# Patient Record
Sex: Female | Born: 1935
Health system: Southern US, Community
[De-identification: ages and names within clinical notes are randomized; demographics above are authoritative.]

## PROBLEM LIST (undated history)

## (undated) DIAGNOSIS — G459 Transient cerebral ischemic attack, unspecified: Secondary | ICD-10-CM

## (undated) DIAGNOSIS — K219 Gastro-esophageal reflux disease without esophagitis: Secondary | ICD-10-CM

## (undated) DIAGNOSIS — F419 Anxiety disorder, unspecified: Secondary | ICD-10-CM

## (undated) DIAGNOSIS — N3090 Cystitis, unspecified without hematuria: Secondary | ICD-10-CM

## (undated) DIAGNOSIS — I4729 Other ventricular tachycardia: Secondary | ICD-10-CM

## (undated) DIAGNOSIS — M1990 Unspecified osteoarthritis, unspecified site: Secondary | ICD-10-CM

## (undated) DIAGNOSIS — I10 Essential (primary) hypertension: Secondary | ICD-10-CM

## (undated) DIAGNOSIS — I493 Ventricular premature depolarization: Secondary | ICD-10-CM

## (undated) DIAGNOSIS — R001 Bradycardia, unspecified: Secondary | ICD-10-CM

## (undated) DIAGNOSIS — I472 Ventricular tachycardia: Secondary | ICD-10-CM

## (undated) HISTORY — DX: Essential (primary) hypertension: I10

## (undated) HISTORY — DX: Anxiety disorder, unspecified: F41.9

## (undated) HISTORY — DX: Transient cerebral ischemic attack, unspecified: G45.9

## (undated) HISTORY — DX: Gastro-esophageal reflux disease without esophagitis: K21.9

## (undated) HISTORY — DX: Cystitis, unspecified without hematuria: N30.90

## (undated) HISTORY — DX: Unspecified osteoarthritis, unspecified site: M19.90

## (undated) HISTORY — DX: Ventricular premature depolarization: I49.3

## (undated) HISTORY — DX: Bradycardia, unspecified: R00.1

## (undated) HISTORY — PX: TOTAL ABDOMINAL HYSTERECTOMY: SHX209

## (undated) HISTORY — DX: Other ventricular tachycardia: I47.29

## (undated) HISTORY — DX: Ventricular tachycardia: I47.2

## (undated) HISTORY — PX: KNEE SURGERY: SHX244

---

## 2001-11-08 ENCOUNTER — Encounter: Payer: Self-pay | Admitting: Internal Medicine

## 2001-11-08 ENCOUNTER — Encounter: Admission: RE | Admit: 2001-11-08 | Discharge: 2001-11-08 | Payer: Self-pay | Admitting: Internal Medicine

## 2001-11-14 ENCOUNTER — Encounter: Payer: Self-pay | Admitting: Internal Medicine

## 2001-11-14 ENCOUNTER — Encounter: Admission: RE | Admit: 2001-11-14 | Discharge: 2001-11-14 | Payer: Self-pay | Admitting: Internal Medicine

## 2002-05-15 ENCOUNTER — Encounter (INDEPENDENT_AMBULATORY_CARE_PROVIDER_SITE_OTHER): Payer: Self-pay | Admitting: Specialist

## 2002-05-15 ENCOUNTER — Ambulatory Visit (HOSPITAL_BASED_OUTPATIENT_CLINIC_OR_DEPARTMENT_OTHER): Admission: RE | Admit: 2002-05-15 | Discharge: 2002-05-15 | Payer: Self-pay | Admitting: Urology

## 2004-06-19 ENCOUNTER — Ambulatory Visit (HOSPITAL_COMMUNITY): Admission: RE | Admit: 2004-06-19 | Discharge: 2004-06-19 | Payer: Self-pay | Admitting: Gastroenterology

## 2004-06-19 ENCOUNTER — Encounter (INDEPENDENT_AMBULATORY_CARE_PROVIDER_SITE_OTHER): Payer: Self-pay | Admitting: Specialist

## 2008-10-27 ENCOUNTER — Emergency Department (HOSPITAL_COMMUNITY): Admission: EM | Admit: 2008-10-27 | Discharge: 2008-10-27 | Payer: Self-pay | Admitting: Family Medicine

## 2008-11-18 ENCOUNTER — Ambulatory Visit (HOSPITAL_COMMUNITY): Admission: RE | Admit: 2008-11-18 | Discharge: 2008-11-18 | Payer: Self-pay | Admitting: Internal Medicine

## 2009-06-05 ENCOUNTER — Ambulatory Visit (HOSPITAL_COMMUNITY): Admission: RE | Admit: 2009-06-05 | Discharge: 2009-06-05 | Payer: Self-pay | Admitting: Gastroenterology

## 2009-06-09 ENCOUNTER — Encounter: Payer: Self-pay | Admitting: Internal Medicine

## 2009-06-10 ENCOUNTER — Ambulatory Visit (HOSPITAL_COMMUNITY): Admission: RE | Admit: 2009-06-10 | Discharge: 2009-06-10 | Payer: Self-pay | Admitting: Interventional Cardiology

## 2009-06-26 DIAGNOSIS — I10 Essential (primary) hypertension: Secondary | ICD-10-CM | POA: Insufficient documentation

## 2009-06-27 ENCOUNTER — Ambulatory Visit: Payer: Self-pay | Admitting: Internal Medicine

## 2009-06-27 DIAGNOSIS — I472 Ventricular tachycardia, unspecified: Secondary | ICD-10-CM | POA: Insufficient documentation

## 2009-06-27 DIAGNOSIS — I4729 Other ventricular tachycardia: Secondary | ICD-10-CM | POA: Insufficient documentation

## 2009-07-09 ENCOUNTER — Ambulatory Visit: Payer: Self-pay | Admitting: Internal Medicine

## 2009-07-09 DIAGNOSIS — I498 Other specified cardiac arrhythmias: Secondary | ICD-10-CM | POA: Insufficient documentation

## 2009-07-10 ENCOUNTER — Ambulatory Visit: Payer: Self-pay | Admitting: Internal Medicine

## 2009-07-10 ENCOUNTER — Inpatient Hospital Stay (HOSPITAL_COMMUNITY): Admission: AD | Admit: 2009-07-10 | Discharge: 2009-07-11 | Payer: Self-pay | Admitting: Internal Medicine

## 2009-07-28 ENCOUNTER — Encounter: Payer: Self-pay | Admitting: Emergency Medicine

## 2009-07-28 ENCOUNTER — Ambulatory Visit: Payer: Self-pay | Admitting: Cardiology

## 2009-07-28 ENCOUNTER — Ambulatory Visit: Payer: Self-pay | Admitting: Internal Medicine

## 2009-07-29 ENCOUNTER — Encounter: Payer: Self-pay | Admitting: Internal Medicine

## 2009-07-29 ENCOUNTER — Encounter: Payer: Self-pay | Admitting: Cardiology

## 2009-07-29 ENCOUNTER — Inpatient Hospital Stay (HOSPITAL_COMMUNITY): Admission: EM | Admit: 2009-07-29 | Discharge: 2009-08-01 | Payer: Self-pay | Admitting: Interventional Cardiology

## 2009-08-05 ENCOUNTER — Emergency Department (HOSPITAL_COMMUNITY): Admission: EM | Admit: 2009-08-05 | Discharge: 2009-08-05 | Payer: Self-pay | Admitting: Emergency Medicine

## 2009-08-06 ENCOUNTER — Encounter: Payer: Self-pay | Admitting: Internal Medicine

## 2009-08-08 ENCOUNTER — Ambulatory Visit (HOSPITAL_COMMUNITY): Admission: AD | Admit: 2009-08-08 | Discharge: 2009-08-09 | Payer: Self-pay | Admitting: Internal Medicine

## 2009-08-08 ENCOUNTER — Ambulatory Visit: Payer: Self-pay | Admitting: Internal Medicine

## 2009-08-25 ENCOUNTER — Telehealth: Payer: Self-pay | Admitting: Internal Medicine

## 2009-08-25 ENCOUNTER — Encounter: Payer: Self-pay | Admitting: Internal Medicine

## 2009-09-26 ENCOUNTER — Ambulatory Visit: Payer: Self-pay | Admitting: Internal Medicine

## 2009-10-10 ENCOUNTER — Encounter: Admission: RE | Admit: 2009-10-10 | Discharge: 2009-10-10 | Payer: Self-pay | Admitting: Gastroenterology

## 2010-09-22 NOTE — Progress Notes (Signed)
Summary: not feeling well  Phone Note Call from Patient Call back at Home Phone [phone]   Caller: Patient Reason for Call: Talk to Nurse Action Taken: Appt Scheduled Summary of Call: feeling very weak and nausea since thursday... no chest pain Initial call taken by: Migdalia Dk,  August 25, 2009 8:08 AM  Follow-up for Phone Call        spoke with pt.  Nausea and weakness gradually went away then came back on Thurs. Her HR for the  first week or two post hospital was  69-72 now teh HR is running  between 82-88. Discussed with Dr Johney Frame he doesn't think it is related to her heart but will have her come in for EKG only Dennis Bast, RN, BSN  August 25, 2009 9:37 AM   Pt came for EKG looks good NSR w [facility] HR-86 Follow-up by: Hillis Range, MD,  August 25, 2009 8:57 PM  Additional Follow-up for Phone Call Additional follow up Details #1::        Pt seen by me in the office today.  Nausea and fatigue are her complaints.  Recent cath normal by Dr Eldridge Dace.  Symptoms preceed ablation procedure.  Today, Ekg reveals sinus rhythm without bradycardia.  there are no pvcs and no ischemia changes.  Will switch H2 blocker to PPI.  Decrease ASA to 81mg  daily.  Follow-up with PCP if symptoms persist.    New/Updated Medications: PROTONIX 40 MG TBEC (PANTOPRAZOLE SODIUM) one by mouth qd Prescriptions: PROTONIX 40 MG TBEC (PANTOPRAZOLE SODIUM) one by mouth qd  #30 x 6   Entered by:   Dennis Bast, RN, BSN   Authorized by:   Hillis Range, MD   Signed by:   Dennis Bast, RN, BSN on 08/25/2009   Method used:   Electronically to        [facility] (retail)       [phone] [address]       [place], [place]  [id number]       Ph: [phone]       Fax: [phone]   RxID:   [id number]    Patient Instructions: 1)  Your physician recommends that you schedule a follow-up appointment in: 4 weeks with Dr Johney Frame 2)  Your physician has recommended you make  the following change in your medication: decrease ASA to 81mg  and start Protonix 40mg  once daily

## 2010-09-22 NOTE — Letter (Signed)
Summary: ELectrophysiology/Ablation Procedure Instructions  [facility], Main Office  [address]   [place], [place] [address]   Phone: [phone]  Fax: [phone]     Electrophysiology/Ablation Procedure Instructions    You are scheduled for a VT ablation  on 08/08/09 at 7:30 am with Dr. Johney Frame  1.  Please come to the Short Stay Center at [facility] at 6:00am on the day of your procedure  2.  Come prepared to stay overnight.   Please bring your insurance cards and a list of your medications.  3.  Do not have anything to eat or drink after midnight the night before your procedure.     * Occasionally, EP studies and ablations can become lengthy.  Please make your family aware of this before your procedure starts.  Average time ranges from 2-8 hours for EP studies/ablations.  Your physician will locate your family after the procedure with the results.  * If you have any questions after you get home, please call the office at [phone].  Anselm Pancoast

## 2010-09-22 NOTE — Consult Note (Signed)
Summary: [facility]   Imported By: Roderic Ovens 08/19/2009 16:08:05  _____________________________________________________________________  External Attachment:    Type:   Image     Comment:   External Document

## 2010-09-22 NOTE — Assessment & Plan Note (Signed)
Summary: 3 wks/okmper kathy/dmp   Visit Type:  Follow-up Referring Provider:  Everette Rank, MD Primary Provider:  Theressa Millard, MD   History of Present Illness: The patient presents today for electrophysiology followup .She reports doing well since last being seen in our clinic. The patient denies symptoms of palpitations, chest pain, shortness of breath, orthopnea, PND, lower extremity edema, dizziness, presyncope, syncope, or neurologic sequela.  She reports occasional fatigue, but feels that this improved since stopping metoprolol. The patient is tolerating medications without difficulties and is otherwise without complaint today.   Current Medications (verified): 1)  Ranitidine Hcl 150 Mg Tabs (Ranitidine Hcl) .... Once Daily 2)  Aspirin 81 Mg Tbec (Aspirin) .... Take One Tablet By Mouth Daily 3)  Verapamil Hcl Cr 180 Mg Xr24h-Cap (Verapamil Hcl) .... Take One Capsule By Mouth Daily 4)  Multivitamins   Tabs (Multiple Vitamin) .... Once Daily 5)  Vitamin B-12 500 Mcg  Tabs (Cyanocobalamin) .... 2 Once Daily 6)  Calcium Carbonate-Vitamin D 600-400 Mg-Unit  Tabs (Calcium Carbonate-Vitamin D) .... Two Times A Day 7)  Estrace 0.1 Mg/gm Crea (Estradiol) .... As Needed 8)  Lidocaine Hcl 2 % Gel (Lidocaine Hcl) .... As Needed 9)  Valium 5 Mg Tabs (Diazepam) .... As Needed  Allergies: 1)  ! Sulfa  Past History:  Past Medical History:  hypertension   cystitis  PVCs, NSVT  GERD  Anxiety  DJD  Bradycardia  Past Surgical History: Reviewed history from 06/27/2009 and no changes required. TAH 1978 s/p knee surgery x 2  Family History: Reviewed history from 06/27/2009 and no changes required. CAD,  1 sister died suddenly in her sleep after her third MI.  Another brother and sister have CAD.  No other h/o sudden death  Social History: Reviewed history from 06/27/2009 and no changes required. Pt lives in [place] with husband.  Retired.  Denies tobacco, ETOH, drugs.  Review of  Systems       All systems are reviewed and negative except as listed in the HPI.   Vital Signs:  Patient profile:   77 year old female Height:      64 inches Weight:      156 pounds BMI:     26.87 Pulse rate:   56 / minute Pulse rhythm:   irregular BP sitting:   126 / 70  (left arm)  Vitals Entered By: Laurance Flatten CMA (July 09, 2009 9:40 AM)  Physical Exam  General:  Well developed, well nourished, in no acute distress. Head:  normocephalic and atraumatic Eyes:  PERRLA/EOM intact; conjunctiva and lids normal. Nose:  no deformity, discharge, inflammation, or lesions Mouth:  Teeth, gums and palate normal. Oral mucosa normal. Neck:  Neck supple, no JVD. No masses, thyromegaly or abnormal cervical nodes. Lungs:  Clear bilaterally to auscultation and percussion. Heart:  Huston Foley regular rhythm, frequent ectopy. Abdomen:  Bowel sounds positive; abdomen soft and non-tender without masses, organomegaly, or hernias noted. No hepatosplenomegaly. Msk:  Back normal, normal gait. Muscle strength and tone normal. Pulses:  pulses normal in all 4 extremities Extremities:  No clubbing or cyanosis. Neurologic:  Alert and oriented x 3. Skin:  Intact without lesions or rashes. Cervical Nodes:  no significant adenopathy Psych:  Normal affect.    CXR  Procedure date:  10/27/2008  Findings:      Normal cardiomediastinal silhouette.  COPD/emphysema.   Linear scarring versus subsegmental atelectasis at the left base.   No airspace opacities.    IMPRESSION:   Findings  consistent with COPD/emphysema.  Linear scarring versus   subsegmental atelectasis at the left base.  [place] of Abdomen  Procedure date:  11/14/2001  Findings:      ABDOMINAL ULTRASOUND  LIVER APPEARS SONOGRAPHICALLY NORMAL.  NO INTRAHEPATIC   DUCTAL DILATATION OR FOCAL HEPATIC LESIONS.  GALLBLADDER   IS SONOGRAPHICALLY NORMAL.  THE COMMON BILE DUCT IS NORMAL   IN CALIBER MEASURING 3.8 MM. THE PANCREAS IS FAIRLY    WELL-VISUALIZED AND DEMONSTRATES NO SONOGRAPHIC   ABNORMALITIES.  BOTH KIDNEYS ARE NORMAL IN SIZE AND   ECHOGENICITY WITHOUT EVIDENCE OF HYDRONEPHROSIS.  LOWER   POLE REGIONS OF BOTH KIDNEYS ARE NOT WELL-VISUALIZED DUE   TO COLONIC GAS.  SPLEEN IS NORMAL IN SIZE AND   ECHOGENICITY.  THE AORTA IS NORMAL IN CALIBER.    IMPRESSION:  1.  UNREMARKABLE ABDOMINAL ULTRASOUND   EXAMINATION   Signed by Marrion Coy, CNA on 06/26/2009 at 2:47 PM  ________________________________________________________________________     Cardiac Cath  Procedure date:  06/10/2009  Findings:       IMPRESSION:   1. No significant coronary artery disease.   2. Low normal left ventricular function.   3. Normal hemodynamics.   4. No abdominal aortic aneurysm or renal artery stenosis.      RECOMMENDATIONS:  We will review the case with EP.  We will obtain an   echocardiogram.  If she continues to have the ventricular tachycardia,   we could either increase her beta-blocker or consider adding a calcium   channel blocker like verapamil.  Depending on EP's recommendations, we   may keep the patient overnight or let her go home.  Regardless, her   episodic ventricular tachycardia needs to be controlled to avoid a   tachycardia-induced cardiomyopathy.               EKG  Procedure date:  07/09/2009  Findings:      sinus rhythm 60 bpm, frequent NSVT (RBB L SUP axis)  Impression & Recommendations:  Problem # 1:  VENTRICULAR TACHYCARDIA, PAROXYSMAL (ICD-427.1) The patient has NSVT and frequent PVCs despite medical therapy with verapamil.  She has not well tolerated metoprolol and further up titration of verapamil is limited by bradycardia.  I am concerned that she may develop a cardiomyopathy with this frequent ventricular ectopy.  I have discussed treatment options including medicines and ablation with the patient.  She is clear that she wishes to defer catheter ablation at this time. After discussion  with Dr Eldridge Dace, I have offered the patient flecainide for suppression of her symptomatic arrhythmia.  She has no significant CAD.  She is aware that this medicine does carry increased risk for ventricular arrhythmias and death.  We will start the patient on flecainide 100mg  two times a day in the hospital at the next available time. No changes are made today.  Her updated medication list for this problem includes:    Aspirin 81 Mg Tbec (Aspirin) .Marland Kitchen... Take one tablet by mouth daily    Verapamil Hcl Cr 180 Mg Xr24h-cap (Verapamil hcl) .Marland Kitchen... Take one capsule by mouth daily  Problem # 2:  HYPERTENSION (ICD-401.9) stable, no changes Her updated medication list for this problem includes:    Aspirin 81 Mg Tbec (Aspirin) .Marland Kitchen... Take one tablet by mouth daily    Verapamil Hcl Cr 180 Mg Xr24h-cap (Verapamil hcl) .Marland Kitchen... Take one capsule by mouth daily  Problem # 3:  BRADYCARDIA (ICD-427.89) As above  Her updated medication list for this problem includes:  Aspirin 81 Mg Tbec (Aspirin) .Marland Kitchen... Take one tablet by mouth daily    Verapamil Hcl Cr 180 Mg Xr24h-cap (Verapamil hcl) .Marland Kitchen... Take one capsule by mouth daily

## 2010-09-22 NOTE — Assessment & Plan Note (Signed)
Summary: eph/post ablation/jml   Visit Type:  Follow-up Referring Provider:  Everette Rank, MD Primary Provider:  Theressa Millard, MD   History of Present Illness: The patient presents today for routine electrophysiology followup. She reports doing very well since last being seen in our clinic. The patient denies symptoms of palpitations, chest pain, shortness of breath, orthopnea, PND, lower extremity edema, dizziness, presyncope, syncope, or neurologic sequela. The patient is tolerating medications without difficulties and is otherwise without complaint today.  Her nausea has resolved since stopping aspirin.  Current Medications (verified): 1)  Protonix 40 Mg Tbec (Pantoprazole Sodium) .... One By Mouth Qd 2)  Multivitamins   Tabs (Multiple Vitamin) .... Once Daily 3)  Vitamin B-12 1000 Mcg Tabs (Cyanocobalamin) .... Once Daily 4)  Calcium Carbonate-Vitamin D 600-400 Mg-Unit  Tabs (Calcium Carbonate-Vitamin D) .... Two Times A Day 5)  Estrace 0.1 Mg/gm Crea (Estradiol) .... As Needed 6)  Lidocaine Hcl 2 % Gel (Lidocaine Hcl) .... As Needed 7)  Valium 5 Mg Tabs (Diazepam) .... As Needed  Allergies: 1)  ! Sulfa  Past History:  Past Medical History:  hypertension   cystitis  PVCs, NSVT s/p successful ablation  GERD  Anxiety  DJD  Bradycardia due to PVCs, resolved after PVC ablation  Social History: Reviewed history from 06/27/2009 and no changes required. Pt lives in [place] with husband.  Retired.  Denies tobacco, ETOH, drugs.  Vital Signs:  Patient profile:   77 year old female Height:      64 inches Weight:      146 pounds BMI:     25.15 Pulse rate:   73 / minute BP sitting:   124 / 75  (right arm)  Vitals Entered By: Laurance Flatten CMA (September 26, 2009 11:27 AM)  Physical Exam  General:  Well developed, well nourished, in no acute distress. Head:  normocephalic and atraumatic Eyes:  PERRLA/EOM intact; conjunctiva and lids normal. Nose:  no deformity,  discharge, inflammation, or lesions Mouth:  Teeth, gums and palate normal. Oral mucosa normal. Neck:  Neck supple, no JVD. No masses, thyromegaly or abnormal cervical nodes. Lungs:  Clear bilaterally to auscultation and percussion. Heart:  Non-displaced PMI, chest non-tender; regular rate and rhythm, S1, S2 without murmurs, rubs or gallops. Carotid upstroke normal, no bruit. Normal abdominal aortic size, no bruits. Femorals normal pulses, no bruits. Pedals normal pulses. No edema, no varicosities. Abdomen:  Bowel sounds positive; abdomen soft and non-tender without masses, organomegaly, or hernias noted. No hepatosplenomegaly. Msk:  Back normal, normal gait. Muscle strength and tone normal. Pulses:  pulses normal in all 4 extremities Extremities:  No clubbing or cyanosis. Neurologic:  Alert and oriented x 3. Skin:  Intact without lesions or rashes. Cervical Nodes:  no significant adenopathy Psych:  Normal affect.   EKG  Procedure date:  09/26/2009  Findings:      sinus rhythm 73 bpm, normal ekg  Impression & Recommendations:  Problem # 1:  VENTRICULAR TACHYCARDIA, PAROXYSMAL (ICD-427.1) The patients PVCs and NSVT were successfully ablation. She is doing well off of any medicines for this. I will see her as needed.  Problem # 2:  BRADYCARDIA (ICD-427.89) resolved with PVC ablation no further workup planned  Problem # 3:     Other Orders: EKG w/ Interpretation (93000)  Patient Instructions: 1)  I have returned the entirety of her cardiology care back to Dr Isabel Caprice.  I will see her as needed.

## 2010-09-22 NOTE — Letter (Signed)
Summary: Dr Eldridge Dace note  Dr Eldridge Dace note   Imported By: Kassie Mends 07/11/2009 13:29:11  _____________________________________________________________________  External Attachment:    Type:   Image     Comment:   External Document

## 2010-09-22 NOTE — Assessment & Plan Note (Signed)
Summary: nep/ VT/PT HAS BCBS/ PER DR.Kirill Chatterjee ON 10/19 GD   Visit Type:  Initial Consult Referring Provider:  Everette Rank, MD Primary Provider:  Theressa Millard, MD  CC:  VT.  History of Present Illness: Crystal Chang is a pleasant 77 yo WF without significant PMH who presents today for EP consultation regarding ventricular arrhyhtmias.  She reports that during a recent colonoscopy, she was observed to have frequent PVCs and nonsustained ventricular tachycardia.  She was asymptomatic at that time.  She was then evaluated by Dr Eldridge Dace  and initiated on metoprolol.  In retrospect she reports symptoms of "fluttering".  She reports that this occurs 2-3 times per day, lasting only several seconds.  She denies chest pain, SOB, or syncope.  She reports remote symptoms of presyncope but none recently.  She also reports orthostatic dizziness.  She had LHC 10/10 which showed EF 50% and no significant CAD. She was started on verapamil at that time.  SInce then, she reports significant decline in energy and decreased exercise tolerance.   Current Medications (verified): 1)  Ranitidine Hcl 150 Mg Tabs (Ranitidine Hcl) .... Once Daily 2)  Aspirin 81 Mg Tbec (Aspirin) .... Take One Tablet By Mouth Daily 3)  Metoprolol Succinate 25 Mg Xr24h-Tab (Metoprolol Succinate) .... Take One Tablet By Mouth Daily 4)  Verapamil Hcl Cr 180 Mg Xr24h-Cap (Verapamil Hcl) .... Take One Capsule By Mouth Daily 5)  Multivitamins   Tabs (Multiple Vitamin) .... Once Daily 6)  Vitamin B-12 500 Mcg  Tabs (Cyanocobalamin) .... 2 Once Daily 7)  Calcium Carbonate-Vitamin D 600-400 Mg-Unit  Tabs (Calcium Carbonate-Vitamin D) .... Two Times A Day 8)  Estrace 0.1 Mg/gm Crea (Estradiol) .... As Needed 9)  Lidocaine Hcl 2 % Gel (Lidocaine Hcl) .... As Needed 10)  Valium 5 Mg Tabs (Diazepam) .... As Needed  Allergies (verified): 1)  ! Sulfa  Past History:  Past Medical History:  hypertension   cystitis  PVCs, NSVT  GERD  Anxiety   DJD  Past Surgical History: TAH 1978 s/p knee surgery x 2  Family History: CAD,  1 sister died suddenly in her sleep after her third MI.  Another brother and sister have CAD.  No other h/o sudden death  Social History: Pt lives in [place] with husband.  Retired.  Denies tobacco, ETOH, drugs.  Review of Systems       All systems are reviewed and negative except as listed in the HPI.   Vital Signs:  Patient profile:   77 year old female Height:      64 inches Weight:      155 pounds BMI:     26.70 Pulse rate:   63 / minute BP sitting:   120 / 64  (left arm) Cuff size:   regular  Vitals Entered By: Hardin Negus, RMA (June 27, 2009 11:44 AM)  Physical Exam  General:  Well developed, well nourished, in no acute distress. Head:  normocephalic and atraumatic Eyes:  PERRLA/EOM intact; conjunctiva and lids normal. Nose:  no deformity, discharge, inflammation, or lesions Mouth:  Teeth, gums and palate normal. Oral mucosa normal. Neck:  Neck supple, no JVD. No masses, thyromegaly or abnormal cervical nodes. Lungs:  Clear bilaterally to auscultation and percussion. Heart:  Non-displaced PMI, chest non-tender; regular rate and rhythm, S1, S2 without murmurs, rubs or gallops. Carotid upstroke normal, no bruit. Normal abdominal aortic size, no bruits. Femorals normal pulses, no bruits. Pedals normal pulses. No edema, no varicosities. Abdomen:  Bowel sounds positive; abdomen soft and non-tender without masses, organomegaly, or hernias noted. No hepatosplenomegaly. Msk:  Back normal, normal gait. Muscle strength and tone normal. Pulses:  pulses normal in all 4 extremities Extremities:  No clubbing or cyanosis. Neurologic:  Alert and oriented x 3. Skin:  Intact without lesions or rashes. Cervical Nodes:  no significant adenopathy Psych:  Normal affect.   EKG  Procedure date:  06/27/2009  Findings:      sinus bradycardia with PVCs in trigeminy.  PVC morphology is RBB L  superior axis.  PVCs are not closely coupled to T wave. Otherwise unremarkable.  Impression & Recommendations:  Problem # 1:  VENTRICULAR TACHYCARDIA, PAROXYSMAL (ICD-427.1) The patient presents today for further evaluation of asymptomatic NSVT and PVCs.  Her arrhythmia burden has significantly improved with verapamil.  She has developed fatigue, likely due to bradycardia (metoprolol).  If have reviewed her echocardiogram and cath from Dr Maryruth Hancock office which reveal preserved EF without significant CAD.  Therapeutic strategies for NSVT including medicine and ablation were discussed in detail with the patient today. Risk, benefits, and alternatives to EP study and radiofrequency ablation were also discussed in detail today. These risks include but are not limited to stroke, bleeding, vascular damage, tamponade, perforation, damage to the heart requiring pacemaker, and death. The patient understands these risk and wishes to continue medical therapy.  She has a preserved EF and is therefore low risk for sudden death.   At this point, I will stop metoprolol and continue verapamil. If she develops symptoms of VT, then we will consider antiarrhythmics or ablation in the future. She will contact my office if presyncope or syncope occur.  Problem # 2:  HYPERTENSION (ICD-401.9) stable, we will stop metoprolol today  The following medications were removed from the medication list:    Metoprolol Succinate 25 Mg Xr24h-tab (Metoprolol succinate) .Marland Kitchen... Take one tablet by mouth daily Her updated medication list for this problem includes:    Aspirin 81 Mg Tbec (Aspirin) .Marland Kitchen... Take one tablet by mouth daily    Verapamil Hcl Cr 180 Mg Xr24h-cap (Verapamil hcl) .Marland Kitchen... Take one capsule by mouth daily  Patient Instructions: 1)  Your physician recommends that you schedule a follow-up appointment in: 3 weeks with Dr Johney Frame 2)  Your physician has recommended you make the following change in your medication: Stop  Metoprolol

## 2010-11-23 LAB — CBC
HCT: 40.5 % (ref 36.0–46.0)
Hemoglobin: 14.2 g/dL (ref 12.0–15.0)
MCHC: 35.1 g/dL (ref 30.0–36.0)
MCV: 93.7 fL (ref 78.0–100.0)
Platelets: 288 10*3/uL (ref 150–400)
RBC: 4.32 MIL/uL (ref 3.87–5.11)
RDW: 12.6 % (ref 11.5–15.5)
WBC: 7.4 10*3/uL (ref 4.0–10.5)

## 2010-11-23 LAB — BASIC METABOLIC PANEL
BUN: 10 mg/dL (ref 6–23)
CO2: 22 mEq/L (ref 19–32)
Calcium: 10 mg/dL (ref 8.4–10.5)
Chloride: 105 mEq/L (ref 96–112)
Creatinine, Ser: 0.82 mg/dL (ref 0.4–1.2)
GFR calc non Af Amer: >60 mL/min (ref >60)
Glucose, Bld: 108 mg/dL — ABNORMAL HIGH (ref 70–99)
Potassium: 3.8 mEq/L (ref 3.5–5.1)
Sodium: 136 mEq/L (ref 135–145)

## 2010-11-23 LAB — PROTIME-INR
INR: 1.11 (ref 0.00–1.49)
Prothrombin Time: 14.2 seconds (ref 11.6–15.2)

## 2010-11-23 LAB — APTT: aPTT: 29 seconds (ref 24–37)

## 2010-11-24 LAB — COMPREHENSIVE METABOLIC PANEL
ALT: 27 U/L (ref 0–35)
AST: 52 U/L — ABNORMAL HIGH (ref 0–37)
Albumin: 3.6 g/dL (ref 3.5–5.2)
Alkaline Phosphatase: 75 U/L (ref 39–117)
BUN: 15 mg/dL (ref 6–23)
CO2: 25 mEq/L (ref 19–32)
Calcium: 9.4 mg/dL (ref 8.4–10.5)
Chloride: 102 mEq/L (ref 96–112)
Creatinine, Ser: 1.26 mg/dL — ABNORMAL HIGH (ref 0.4–1.2)
GFR calc Af Amer: 50 mL/min — ABNORMAL LOW (ref >60)
GFR calc non Af Amer: 41 mL/min — ABNORMAL LOW (ref >60)
Glucose, Bld: 156 mg/dL — ABNORMAL HIGH (ref 70–99)
Potassium: 3.7 mEq/L (ref 3.5–5.1)
Sodium: 134 mEq/L — ABNORMAL LOW (ref 135–145)
Total Bilirubin: 0.8 mg/dL (ref 0.3–1.2)
Total Protein: 6.6 g/dL (ref 6.0–8.3)

## 2010-11-24 LAB — URINE MICROSCOPIC-ADD ON

## 2010-11-24 LAB — CBC
HCT: 35 % — ABNORMAL LOW (ref 36.0–46.0)
HCT: 36.3 % (ref 36.0–46.0)
HCT: 38.4 % (ref 36.0–46.0)
HCT: 38.9 % (ref 36.0–46.0)
HCT: 39.4 % (ref 36.0–46.0)
Hemoglobin: 12.3 g/dL (ref 12.0–15.0)
Hemoglobin: 12.4 g/dL (ref 12.0–15.0)
Hemoglobin: 13 g/dL (ref 12.0–15.0)
Hemoglobin: 13.4 g/dL (ref 12.0–15.0)
Hemoglobin: 13.8 g/dL (ref 12.0–15.0)
MCHC: 33.8 g/dL (ref 30.0–36.0)
MCHC: 34.3 g/dL (ref 30.0–36.0)
MCHC: 34.5 g/dL (ref 30.0–36.0)
MCHC: 35 g/dL (ref 30.0–36.0)
MCHC: 35.1 g/dL (ref 30.0–36.0)
MCV: 93.9 fL (ref 78.0–100.0)
MCV: 94.4 fL (ref 78.0–100.0)
MCV: 94.7 fL (ref 78.0–100.0)
MCV: 94.9 fL (ref 78.0–100.0)
MCV: 94.9 fL (ref 78.0–100.0)
Platelets: 263 10*3/uL (ref 150–400)
Platelets: 288 10*3/uL (ref 150–400)
Platelets: 294 10*3/uL (ref 150–400)
Platelets: 318 10*3/uL (ref 150–400)
Platelets: 338 10*3/uL (ref 150–400)
RBC: 3.69 MIL/uL — ABNORMAL LOW (ref 3.87–5.11)
RBC: 3.82 MIL/uL — ABNORMAL LOW (ref 3.87–5.11)
RBC: 4.07 MIL/uL (ref 3.87–5.11)
RBC: 4.11 MIL/uL (ref 3.87–5.11)
RBC: 4.2 MIL/uL (ref 3.87–5.11)
RDW: 12.3 % (ref 11.5–15.5)
RDW: 12.5 % (ref 11.5–15.5)
RDW: 12.5 % (ref 11.5–15.5)
RDW: 12.8 % (ref 11.5–15.5)
RDW: 12.8 % (ref 11.5–15.5)
WBC: 10.2 10*3/uL (ref 4.0–10.5)
WBC: 10.2 10*3/uL (ref 4.0–10.5)
WBC: 11.2 10*3/uL — ABNORMAL HIGH (ref 4.0–10.5)
WBC: 8.1 10*3/uL (ref 4.0–10.5)
WBC: 8.6 10*3/uL (ref 4.0–10.5)

## 2010-11-24 LAB — LIPID PANEL
HDL: 55 mg/dL (ref >39)
Total CHOL/HDL Ratio: 3.1 RATIO
Triglycerides: 91 mg/dL (ref <150)
VLDL: 18 mg/dL (ref 0–40)

## 2010-11-24 LAB — BASIC METABOLIC PANEL
BUN: 10 mg/dL (ref 6–23)
BUN: 10 mg/dL (ref 6–23)
BUN: 10 mg/dL (ref 6–23)
BUN: 10 mg/dL (ref 6–23)
BUN: 18 mg/dL (ref 6–23)
CO2: 22 mEq/L (ref 19–32)
CO2: 26 mEq/L (ref 19–32)
CO2: 27 mEq/L (ref 19–32)
CO2: 28 mEq/L (ref 19–32)
CO2: 28 mEq/L (ref 19–32)
Calcium: 9.2 mg/dL (ref 8.4–10.5)
Calcium: 9.3 mg/dL (ref 8.4–10.5)
Calcium: 9.7 mg/dL (ref 8.4–10.5)
Calcium: 9.8 mg/dL (ref 8.4–10.5)
Calcium: 9.8 mg/dL (ref 8.4–10.5)
Chloride: 101 mEq/L (ref 96–112)
Chloride: 102 mEq/L (ref 96–112)
Chloride: 105 mEq/L (ref 96–112)
Chloride: 106 mEq/L (ref 96–112)
Chloride: 107 mEq/L (ref 96–112)
Creatinine, Ser: 0.84 mg/dL (ref 0.4–1.2)
Creatinine, Ser: 0.88 mg/dL (ref 0.4–1.2)
Creatinine, Ser: 0.89 mg/dL (ref 0.4–1.2)
Creatinine, Ser: 1 mg/dL (ref 0.4–1.2)
Creatinine, Ser: 1.06 mg/dL (ref 0.4–1.2)
GFR calc Af Amer: >60 mL/min (ref >60)
GFR calc non Af Amer: 50 mL/min — ABNORMAL LOW (ref >60)
GFR calc non Af Amer: 54 mL/min — ABNORMAL LOW (ref >60)
GFR calc non Af Amer: >60 mL/min (ref >60)
GFR calc non Af Amer: >60 mL/min (ref >60)
GFR calc non Af Amer: >60 mL/min (ref >60)
Glucose, Bld: 100 mg/dL — ABNORMAL HIGH (ref 70–99)
Glucose, Bld: 104 mg/dL — ABNORMAL HIGH (ref 70–99)
Glucose, Bld: 105 mg/dL — ABNORMAL HIGH (ref 70–99)
Glucose, Bld: 119 mg/dL — ABNORMAL HIGH (ref 70–99)
Glucose, Bld: 92 mg/dL (ref 70–99)
Potassium: 3.9 mEq/L (ref 3.5–5.1)
Potassium: 4.1 mEq/L (ref 3.5–5.1)
Potassium: 4.1 mEq/L (ref 3.5–5.1)
Potassium: 4.2 mEq/L (ref 3.5–5.1)
Potassium: 4.9 mEq/L (ref 3.5–5.1)
Sodium: 133 mEq/L — ABNORMAL LOW (ref 135–145)
Sodium: 133 mEq/L — ABNORMAL LOW (ref 135–145)
Sodium: 138 mEq/L (ref 135–145)
Sodium: 139 mEq/L (ref 135–145)
Sodium: 140 mEq/L (ref 135–145)

## 2010-11-24 LAB — CK TOTAL AND CKMB (NOT AT ARMC)
CK, MB: 6.9 ng/mL — ABNORMAL HIGH (ref 0.3–4.0)
Relative Index: 5 — ABNORMAL HIGH (ref 0.0–2.5)
Total CK: 137 U/L (ref 7–177)

## 2010-11-24 LAB — POCT I-STAT, CHEM 8
BUN: 23 mg/dL (ref 6–23)
Calcium, Ion: 1.13 mmol/L (ref 1.12–1.32)
Chloride: 105 mEq/L (ref 96–112)
Creatinine, Ser: 0.7 mg/dL (ref 0.4–1.2)
Glucose, Bld: 105 mg/dL — ABNORMAL HIGH (ref 70–99)
HCT: 43 % (ref 36.0–46.0)
Hemoglobin: 14.6 g/dL (ref 12.0–15.0)
Potassium: 4.6 mEq/L (ref 3.5–5.1)
Sodium: 133 mEq/L — ABNORMAL LOW (ref 135–145)
TCO2: 25 mmol/L (ref 0–100)

## 2010-11-24 LAB — POCT CARDIAC MARKERS
CKMB, poc: 1.8 ng/mL (ref 1.0–8.0)
CKMB, poc: 3.2 ng/mL (ref 1.0–8.0)
CKMB, poc: 5.2 ng/mL (ref 1.0–8.0)
CKMB, poc: 5.5 ng/mL (ref 1.0–8.0)
Myoglobin, poc: 114 ng/mL (ref 12–200)
Myoglobin, poc: 164 ng/mL (ref 12–200)
Myoglobin, poc: 191 ng/mL (ref 12–200)
Myoglobin, poc: 233 ng/mL (ref 12–200)
Troponin i, poc: 0.22 ng/mL — ABNORMAL HIGH (ref 0.00–0.09)
Troponin i, poc: 0.24 ng/mL — ABNORMAL HIGH (ref 0.00–0.09)
Troponin i, poc: <0.05 ng/mL (ref 0.00–0.09)
Troponin i, poc: <0.05 ng/mL (ref 0.00–0.09)

## 2010-11-24 LAB — DIFFERENTIAL
Basophils Absolute: 0.1 10*3/uL (ref 0.0–0.1)
Basophils Relative: 1 % (ref 0–1)
Eosinophils Absolute: 0.1 10*3/uL (ref 0.0–0.7)
Eosinophils Relative: 1 % (ref 0–5)
Lymphocytes Relative: 13 % (ref 12–46)
Lymphs Abs: 1.4 10*3/uL (ref 0.7–4.0)
Monocytes Absolute: 0.8 10*3/uL (ref 0.1–1.0)
Monocytes Relative: 7 % (ref 3–12)
Neutro Abs: 8.9 10*3/uL — ABNORMAL HIGH (ref 1.7–7.7)
Neutrophils Relative %: 79 % — ABNORMAL HIGH (ref 43–77)

## 2010-11-24 LAB — HEPARIN LEVEL (UNFRACTIONATED)

## 2010-11-24 LAB — URINALYSIS, ROUTINE W REFLEX MICROSCOPIC
Bilirubin Urine: NEGATIVE
Glucose, UA: NEGATIVE mg/dL
Hgb urine dipstick: NEGATIVE
Nitrite: NEGATIVE
Protein, ur: 30 mg/dL — AB
Specific Gravity, Urine: 1.017 (ref 1.005–1.030)
Urobilinogen, UA: 0.2 mg/dL (ref 0.0–1.0)
pH: 6.5 (ref 5.0–8.0)

## 2010-11-24 LAB — CARDIAC PANEL(CRET KIN+CKTOT+MB+TROPI)
CK, MB: 3.8 ng/mL (ref 0.3–4.0)
CK, MB: 6.4 ng/mL — ABNORMAL HIGH (ref 0.3–4.0)
Relative Index: 5.4 — ABNORMAL HIGH (ref 0.0–2.5)
Total CK: 119 U/L (ref 7–177)
Total CK: 91 U/L (ref 7–177)

## 2010-11-24 LAB — MAGNESIUM
Magnesium: 2 mg/dL (ref 1.5–2.5)
Magnesium: 2 mg/dL (ref 1.5–2.5)
Magnesium: 2.1 mg/dL (ref 1.5–2.5)

## 2010-11-24 LAB — TSH: TSH: 0.726 u[IU]/mL (ref 0.350–4.500)

## 2010-11-24 LAB — BRAIN NATRIURETIC PEPTIDE: Pro B Natriuretic peptide (BNP): 344 pg/mL — ABNORMAL HIGH (ref 0.0–100.0)

## 2010-11-24 LAB — APTT: aPTT: 30 seconds (ref 24–37)

## 2010-11-24 LAB — TROPONIN I: Troponin I: 0.49 ng/mL — ABNORMAL HIGH (ref 0.00–0.06)

## 2010-11-24 LAB — GLUCOSE, CAPILLARY: Glucose-Capillary: 99 mg/dL (ref 70–99)

## 2010-11-24 LAB — LIPASE, BLOOD: Lipase: 21 U/L (ref 11–59)

## 2010-11-24 LAB — PROTIME-INR
INR: 1.16 (ref 0.00–1.49)
Prothrombin Time: 14.7 seconds (ref 11.6–15.2)

## 2010-11-25 LAB — CBC
HCT: 38 % (ref 36.0–46.0)
Hemoglobin: 13.4 g/dL (ref 12.0–15.0)
MCHC: 35.3 g/dL (ref 30.0–36.0)
MCV: 94.4 fL (ref 78.0–100.0)
Platelets: 245 10*3/uL (ref 150–400)
RBC: 4.02 MIL/uL (ref 3.87–5.11)
RDW: 12.8 % (ref 11.5–15.5)
WBC: 6.5 10*3/uL (ref 4.0–10.5)

## 2010-11-25 LAB — MAGNESIUM: Magnesium: 2.1 mg/dL (ref 1.5–2.5)

## 2010-11-25 LAB — BASIC METABOLIC PANEL
BUN: 15 mg/dL (ref 6–23)
CO2: 27 mEq/L (ref 19–32)
Calcium: 10 mg/dL (ref 8.4–10.5)
Chloride: 106 mEq/L (ref 96–112)
Creatinine, Ser: 1.08 mg/dL (ref 0.4–1.2)
GFR calc Af Amer: 60 mL/min — ABNORMAL LOW (ref >60)
GFR calc non Af Amer: 49 mL/min — ABNORMAL LOW (ref >60)
Glucose, Bld: 114 mg/dL — ABNORMAL HIGH (ref 70–99)
Potassium: 3.9 mEq/L (ref 3.5–5.1)
Sodium: 140 mEq/L (ref 135–145)

## 2010-11-25 LAB — PROTIME-INR
INR: 1.09 (ref 0.00–1.49)
Prothrombin Time: 14 seconds (ref 11.6–15.2)

## 2010-11-26 LAB — BASIC METABOLIC PANEL
BUN: 9 mg/dL (ref 6–23)
CO2: 24 mEq/L (ref 19–32)
Calcium: 9.6 mg/dL (ref 8.4–10.5)
Chloride: 111 mEq/L (ref 96–112)
Creatinine, Ser: 0.78 mg/dL (ref 0.4–1.2)
GFR calc Af Amer: >60 mL/min (ref >60)
GFR calc non Af Amer: >60 mL/min (ref >60)
Glucose, Bld: 101 mg/dL — ABNORMAL HIGH (ref 70–99)
Potassium: 3.9 mEq/L (ref 3.5–5.1)
Sodium: 140 mEq/L (ref 135–145)

## 2010-11-26 LAB — CBC
HCT: 39 % (ref 36.0–46.0)
Hemoglobin: 13.5 g/dL (ref 12.0–15.0)
MCHC: 34.6 g/dL (ref 30.0–36.0)
MCV: 95 fL (ref 78.0–100.0)
Platelets: 299 10*3/uL (ref 150–400)
RBC: 4.11 MIL/uL (ref 3.87–5.11)
RDW: 13.3 % (ref 11.5–15.5)
WBC: 7.4 10*3/uL (ref 4.0–10.5)

## 2011-01-05 NOTE — Assessment & Plan Note (Signed)
[facility]                                 ON-CALL NOTE   KRISHIKA, BUGGE                         MRN:          [id number]  DATE:08/02/2009                            DOB:          1930-05-23    PRIMARY ELECTROPHYSIOLOGIST:  Hillis Range, MD   PRIMARY CARDIOLOGIST:  Corky Crafts, MD   PROBLEM:  The patient contacted me earlier this morning complaining of  nausea, lightheadedness.  She was discharged home yesterday, on  Lopressor 25 b.i.d., after being taken off flecainide, which she was not  tolerating.  She has nonobstructive CAD and an ejection fraction of 50%.  She has documented, frequent NSVT.   PLAN:  I instructed Ms. Hightower to take her vital signs later this  afternoon, before her next scheduled dose of Lopressor, and to contact  me.  She has been given parameters to hold the Lopressor, based on blood  pressure and heart rate readings.  She is also to contact me in the  event of her symptoms worsening, at which time we may consider having  her come directly back to the hospital, given her ongoing symptoms.      Rozell Searing, PA-C  Electronically Signed      Rollene Rotunda, MD, [facility]  Electronically Signed   GS/MedQ  DD: 08/02/2009  DT: 08/02/2009  Job #: [id number]

## 2011-01-08 NOTE — Op Note (Signed)
NAMEHILMA, STEINHILBER                              ACCOUNT NO.:  [id number]   MEDICAL RECORD NO.:  [id number]                   PATIENT TYPE:  AMB   LOCATION:  [facility]                                 FACILITY:  [facility]   PHYSICIAN:  Jamison Neighbor, M.D.               DATE OF BIRTH:  10/18/1929   DATE OF PROCEDURE:  05/15/2002  DATE OF DISCHARGE:                                 OPERATIVE REPORT   PREOPERATIVE DIAGNOSIS:  Chronic pelvic pain, rule out interstitial  cystitis.   SECONDARY DIAGNOSIS:  Urethral caruncle.   POSTOPERATIVE DIAGNOSIS:  Chronic pelvic pain, rule out interstitial  cystitis.   PROCEDURE:  1. Cystoscopy.  2. Urethral calibration.  3. Hydrodistention of bladder.  4. Marcaine and Pyridium instillation.  5. Marcaine and Kenalog injection.  6. Bladder biopsy.   SURGEON:  Jamison Neighbor, M.D.   ANESTHESIA:  General.   COMPLICATIONS:  None.   DRAINS:  None.   BRIEF HISTORY:  This 77 year old female presented to my office in September  for evaluation of redness of the tip of the urethra.  The patient was found  to have a urethral caruncle and was treated with estrogen cream.  The  patient has had some resolution of that problem.  The patient has had  episodes of bladder pain and, in the past, had been told that she had  chronic cystitis.  She was seen by another urologist, who told her there is  nothing more that can be done for you.  The patient did some reading on her  own and concluded that she might have interstitial cystitis.  She referred  herself for evaluation of that condition.  At the time of her evaluation, we  found that she did have bladder and urethral pain, and it was thought that  she might indeed have IC.  The patient was given the option of either in-  office potassium test or cystoscopy and hydrodistention.  Based on her  reading, she elected to undergo the hydrodistention.  We have made  arrangements for her to have excision of the urethral  caruncle if it is  still present at the time of the evaluation.  The patient understands the  risks and benefits of the procedure including the fact that there is no  guarantee that she actually does have IC.  If she does have IC, she realizes  this may be a therapeutic hydrodistention but is primarily being done for  diagnostic purposes.  She gave full and informed consent.   DESCRIPTION OF PROCEDURE:  After the successful induction of general  anesthesia, the patient was placed in the dorsal lithotomy position, prepped  with Betadine, and draped in the usual sterile fashion.  Visual inspection  of the urethra showed that the caruncle had decreased significantly, and the  redness and pain in that area is much improved.  For that reason,  there is  no need to excise any caruncle.  The bimanual examination revealed no  palpable masses.  There was no evidence of diverticulum, cystocele,  rectocele, or enterocele.  The urethra was calibrated up to 53 [place] with  female urethral sounds with no significant evidence of stenosis or  stricture.  The cystoscope was inserted, and the bladder was carefully  inspected.  It was free of any tumor or stones.  Both ureteral orifices were  normal in configuration and location.  The patient's ureteral orifices were  seen to efflux clear urine.  The bladder was then distended at a pressure of  100 cm of water for five minutes.  When the bladder was drained, the patient  had an excellent bladder capacity of 1450 cc.  This would suggest that IC is  a very unlikely diagnosis.  The patient had been previously told that the  average bladder capacity for an adult female is approximately 1150 cc and  that the average for an interstitial cystitis patient is 575.  Very few if  any glomerulations were seen, and the bladder mucosa was essentially normal  following hydrodistention.  A bladder biopsy was performed.  This will be  sent to look for carcinoma in situ and  will also be sent for mast cell  analysis.  The biopsy site was cauterized with a Bugbee electrode.  The  bladder was drained.  A mixture of Marcaine and Pyridium was left within the  bladder.  Marcaine and Kenalog were injected periurethrally.  The patient  had a small vaginal pack applied.  She was given intraoperative Zofran and  Toradol as well as a B&O suppository.  She will return to the office in  follow-up.  She will be sent home with Darvocet-N 100 for pain, Pyridium  Plus for any postoperative symptoms management, and will be maintained on  antibiotic prophylaxis with Macrodantin.  When the patient returns in follow-  up, we will discuss these findings with her and likely will suggest that  what she will need is antibiotic prophylaxis to prevent infections and  symptomatic management using Pyridium Plus or other urinary agents.  It is  not felt likely that IC-directed therapy will be necessary unless the  bladder biopsy is markedly positive for mast cells.                                               Jamison Neighbor, M.D.    RJE/MEDQ  D:  05/15/2002  T:  05/15/2002  Job:  [id number]   cc:   Theressa Millard, M.D.  [address] [place]  [place] [address]  Fax: [phone]

## 2012-04-26 ENCOUNTER — Encounter: Payer: Self-pay | Admitting: Internal Medicine

## 2014-01-02 ENCOUNTER — Other Ambulatory Visit: Payer: Self-pay | Admitting: Gastroenterology

## 2015-05-30 ENCOUNTER — Other Ambulatory Visit: Payer: Self-pay | Admitting: Geriatric Medicine

## 2015-05-30 DIAGNOSIS — R51 Headache: Principal | ICD-10-CM

## 2015-05-30 DIAGNOSIS — R519 Headache, unspecified: Secondary | ICD-10-CM

## 2015-06-17 ENCOUNTER — Ambulatory Visit
Admission: RE | Admit: 2015-06-17 | Discharge: 2015-06-17 | Disposition: A | Discharge status: Home / Self Care | Payer: PPO | Source: Ambulatory Visit | Attending: Geriatric Medicine | Admitting: Geriatric Medicine

## 2015-06-17 DIAGNOSIS — R51 Headache: Principal | ICD-10-CM

## 2015-06-17 DIAGNOSIS — R519 Headache, unspecified: Secondary | ICD-10-CM

## 2015-06-17 MED ORDER — GADOBENATE DIMEGLUMINE 529 MG/ML IV SOLN
14.0000 mL | Freq: Once | INTRAVENOUS | Status: AC | PRN
  Administered 2015-06-17: 14 mL via INTRAVENOUS

## 2015-12-22 DIAGNOSIS — Z1231 Encounter for screening mammogram for malignant neoplasm of breast: Secondary | ICD-10-CM | POA: Diagnosis not present

## 2016-01-27 DIAGNOSIS — H26493 Other secondary cataract, bilateral: Secondary | ICD-10-CM | POA: Diagnosis not present

## 2016-01-27 DIAGNOSIS — H04123 Dry eye syndrome of bilateral lacrimal glands: Secondary | ICD-10-CM | POA: Diagnosis not present

## 2016-01-27 DIAGNOSIS — H524 Presbyopia: Secondary | ICD-10-CM | POA: Diagnosis not present

## 2016-01-28 ENCOUNTER — Other Ambulatory Visit: Payer: Self-pay | Admitting: Geriatric Medicine

## 2016-01-28 DIAGNOSIS — Z0000 Encounter for general adult medical examination without abnormal findings: Secondary | ICD-10-CM | POA: Diagnosis not present

## 2016-01-28 DIAGNOSIS — K219 Gastro-esophageal reflux disease without esophagitis: Secondary | ICD-10-CM | POA: Diagnosis not present

## 2016-01-28 DIAGNOSIS — M8588 Other specified disorders of bone density and structure, other site: Secondary | ICD-10-CM | POA: Diagnosis not present

## 2016-01-28 DIAGNOSIS — R229 Localized swelling, mass and lump, unspecified: Principal | ICD-10-CM

## 2016-01-28 DIAGNOSIS — R2231 Localized swelling, mass and lump, right upper limb: Secondary | ICD-10-CM | POA: Diagnosis not present

## 2016-01-28 DIAGNOSIS — E7800 Pure hypercholesterolemia, unspecified: Secondary | ICD-10-CM | POA: Diagnosis not present

## 2016-01-28 DIAGNOSIS — Z1389 Encounter for screening for other disorder: Secondary | ICD-10-CM | POA: Diagnosis not present

## 2016-01-28 DIAGNOSIS — Z79899 Other long term (current) drug therapy: Secondary | ICD-10-CM | POA: Diagnosis not present

## 2016-01-28 DIAGNOSIS — IMO0002 Reserved for concepts with insufficient information to code with codable children: Secondary | ICD-10-CM

## 2016-01-28 DIAGNOSIS — I10 Essential (primary) hypertension: Secondary | ICD-10-CM | POA: Diagnosis not present

## 2016-02-04 ENCOUNTER — Ambulatory Visit
Admission: RE | Admit: 2016-02-04 | Discharge: 2016-02-04 | Disposition: A | Discharge status: Home / Self Care | Payer: PPO | Source: Ambulatory Visit | Attending: Geriatric Medicine | Admitting: Geriatric Medicine

## 2016-02-04 DIAGNOSIS — R2231 Localized swelling, mass and lump, right upper limb: Secondary | ICD-10-CM | POA: Diagnosis not present

## 2016-02-04 DIAGNOSIS — R229 Localized swelling, mass and lump, unspecified: Principal | ICD-10-CM

## 2016-02-04 DIAGNOSIS — IMO0002 Reserved for concepts with insufficient information to code with codable children: Secondary | ICD-10-CM

## 2016-02-04 MED ORDER — GADOBENATE DIMEGLUMINE 529 MG/ML IV SOLN
14.0000 mL | Freq: Once | INTRAVENOUS | Status: AC | PRN
  Administered 2016-02-04: 14 mL via INTRAVENOUS

## 2016-04-01 DIAGNOSIS — M8588 Other specified disorders of bone density and structure, other site: Secondary | ICD-10-CM | POA: Diagnosis not present

## 2016-11-16 DIAGNOSIS — H8309 Labyrinthitis, unspecified ear: Secondary | ICD-10-CM | POA: Diagnosis not present

## 2016-12-27 DIAGNOSIS — Z1231 Encounter for screening mammogram for malignant neoplasm of breast: Secondary | ICD-10-CM | POA: Diagnosis not present

## 2017-01-12 DIAGNOSIS — D225 Melanocytic nevi of trunk: Secondary | ICD-10-CM | POA: Diagnosis not present

## 2017-01-12 DIAGNOSIS — L570 Actinic keratosis: Secondary | ICD-10-CM | POA: Diagnosis not present

## 2017-01-12 DIAGNOSIS — L814 Other melanin hyperpigmentation: Secondary | ICD-10-CM | POA: Diagnosis not present

## 2017-01-12 DIAGNOSIS — L821 Other seborrheic keratosis: Secondary | ICD-10-CM | POA: Diagnosis not present

## 2017-01-12 DIAGNOSIS — L720 Epidermal cyst: Secondary | ICD-10-CM | POA: Diagnosis not present

## 2017-01-12 DIAGNOSIS — D1801 Hemangioma of skin and subcutaneous tissue: Secondary | ICD-10-CM | POA: Diagnosis not present

## 2017-02-01 DIAGNOSIS — H26493 Other secondary cataract, bilateral: Secondary | ICD-10-CM | POA: Diagnosis not present

## 2017-02-02 DIAGNOSIS — Z79899 Other long term (current) drug therapy: Secondary | ICD-10-CM | POA: Diagnosis not present

## 2017-02-02 DIAGNOSIS — Z0000 Encounter for general adult medical examination without abnormal findings: Secondary | ICD-10-CM | POA: Diagnosis not present

## 2017-02-02 DIAGNOSIS — K219 Gastro-esophageal reflux disease without esophagitis: Secondary | ICD-10-CM | POA: Diagnosis not present

## 2017-02-02 DIAGNOSIS — Z23 Encounter for immunization: Secondary | ICD-10-CM | POA: Diagnosis not present

## 2017-02-02 DIAGNOSIS — I10 Essential (primary) hypertension: Secondary | ICD-10-CM | POA: Diagnosis not present

## 2017-02-02 DIAGNOSIS — E7800 Pure hypercholesterolemia, unspecified: Secondary | ICD-10-CM | POA: Diagnosis not present

## 2017-02-02 DIAGNOSIS — Z1389 Encounter for screening for other disorder: Secondary | ICD-10-CM | POA: Diagnosis not present

## 2017-04-02 IMAGING — MR MR FOREARM*R* WO/W CM
8 series · 38 of 40 positions shown · IV contrast (14ml multihance)
Comparison: None.

CLINICAL DATA: Right forearm mass.

EXAM:
MRI OF THE RIGHT FOREARM WITHOUT AND WITH CONTRAST
TECHNIQUE: Multiplanar, multisequence MR imaging was performed both before and
after administration of intravenous contrast.
CONTRAST:  14mL MULTIHANCE GADOBENATE DIMEGLUMINE 529 MG/ML IV SOLN

[Series 4: T1 · axial · 5.0mm · 0.44mm/px · z∈[-78,+42]mm · 6 of 22 slices shown (1 of 2)]
[im 1/22]
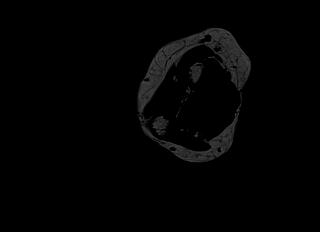
[im 5/22]
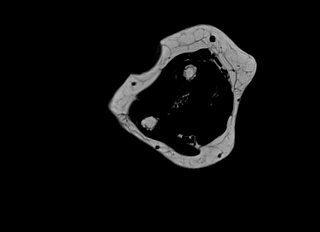
[im 9/22]
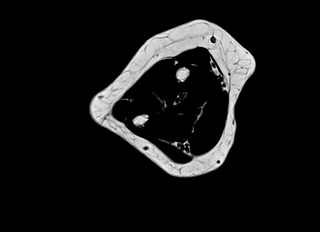
[im 13/22]
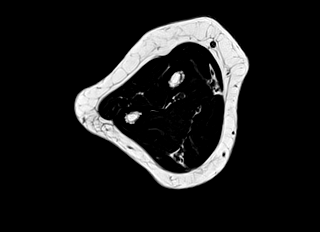
[im 17/22]
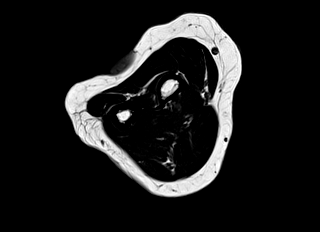
[im 22/22]
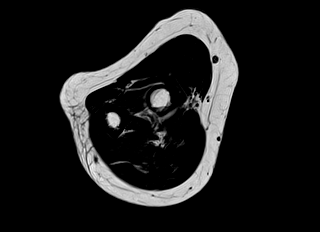

[Series 5: T2 fat-sat · axial · 5.0mm · 0.27mm/px · z∈[-78,+42]mm · 6 of 22 slices shown (1 of 2)]
[im 1/22]
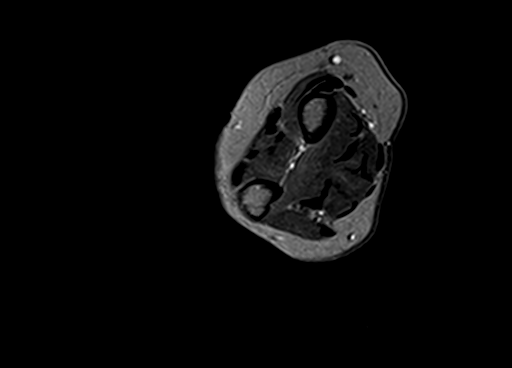
[im 5/22]
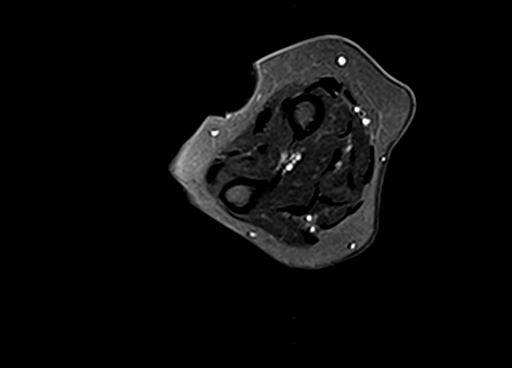
[im 9/22]
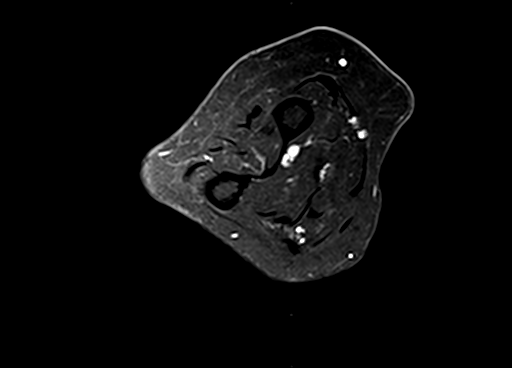
[im 13/22]
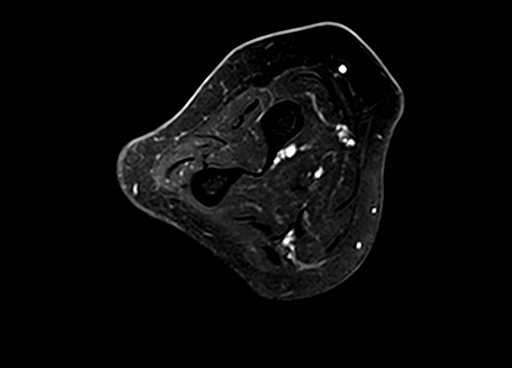
[im 17/22]
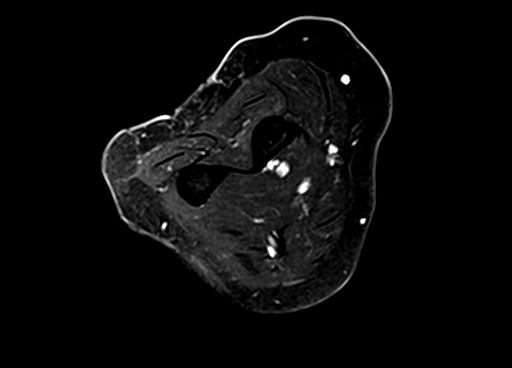
[im 22/22]
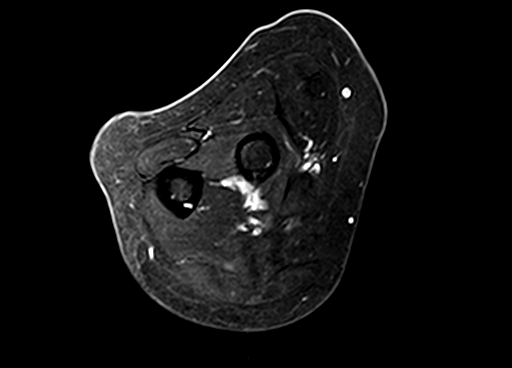

[Series 6: T1 · sagittal · 4.0mm · 0.50mm/px · 4 of 13 slices shown (2 of 2)]
[im 1/13]
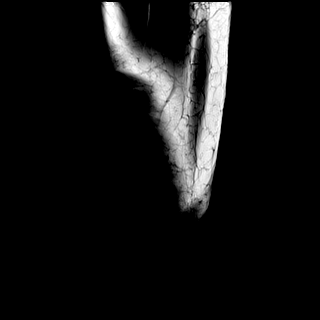
[im 5/13]
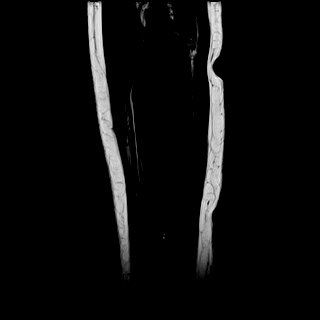
[im 9/13]
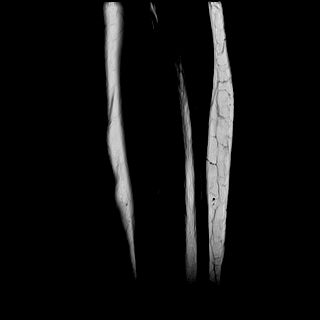
[im 13/13]
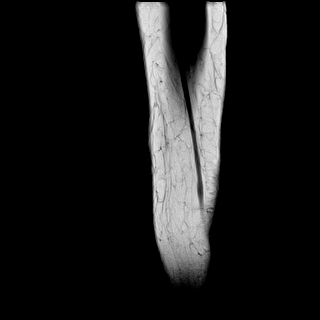

[Series 7: T2 fat-sat · sagittal · 4.0mm · 0.31mm/px · 4 of 13 slices shown (2 of 2)]
[im 1/13]
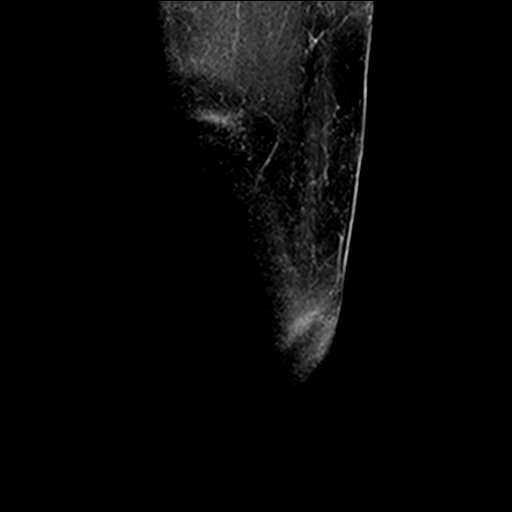
[im 5/13]
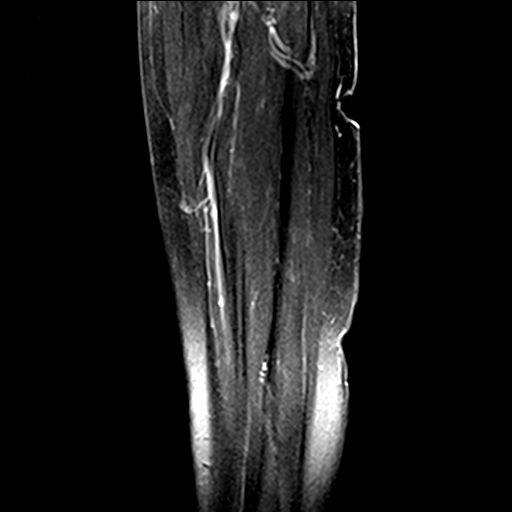
[im 9/13]
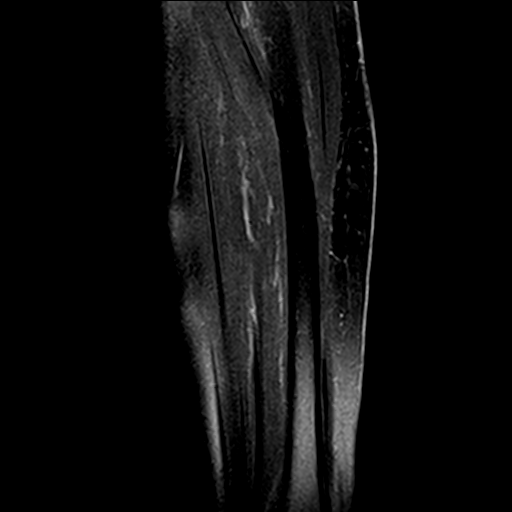
[im 13/13]
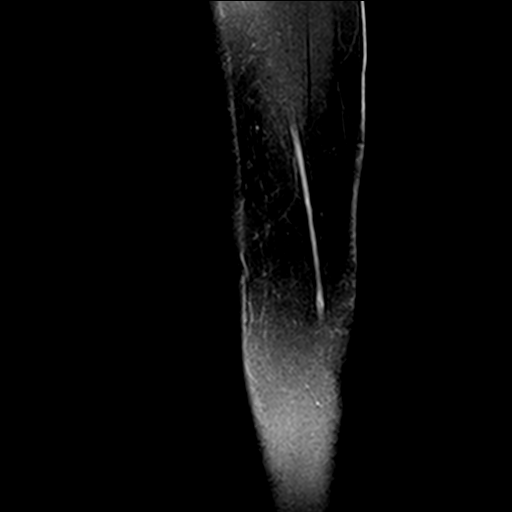

[Series 8: STIR · coronal · 4.0mm · 0.31mm/px · 2 of 13 slices shown]
[im 1/13]
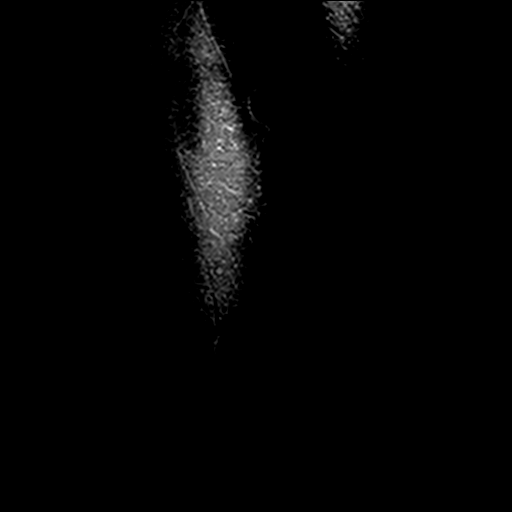
[im 5/13]
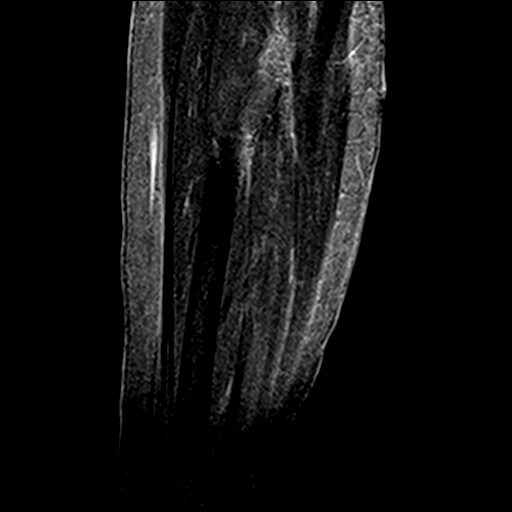

[Series 9: T1 fat-sat · axial · 5.0mm · 0.44mm/px · z∈[-78,+42]mm · 6 of 22 slices shown]
[im 1/22]
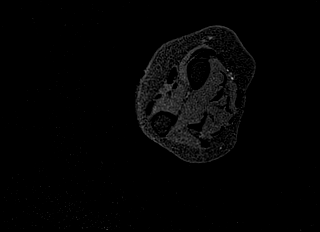
[im 5/22]
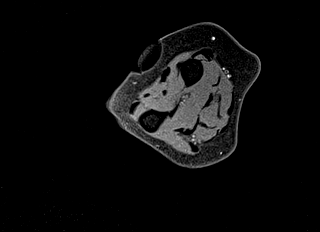
[im 9/22]
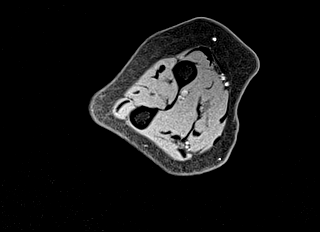
[im 13/22]
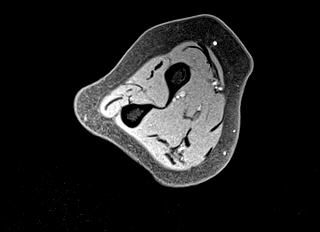
[im 17/22]
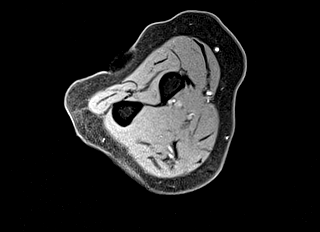
[im 22/22]
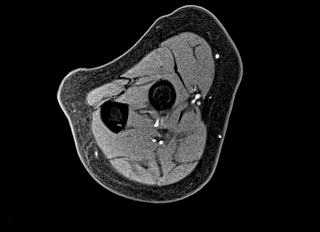

[Series 10: T1 fat-sat post-contrast · axial · 5.0mm · 0.44mm/px · z∈[-78,+42]mm · 6 of 22 slices shown (1 of 2)]
[im 1/22]
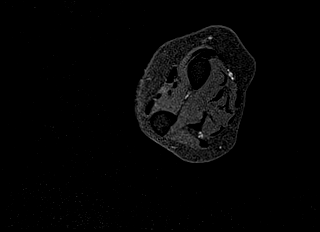
[im 5/22]
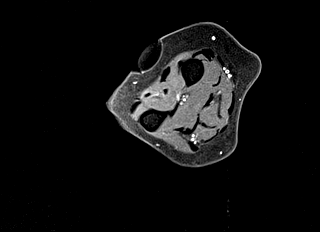
[im 9/22]
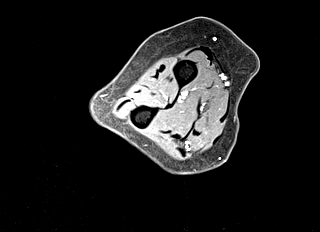
[im 13/22]
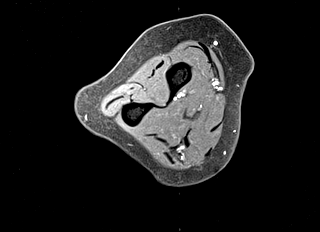
[im 17/22]
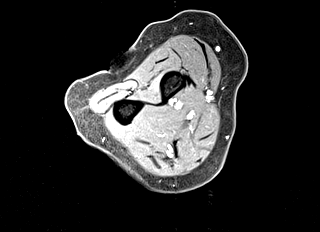
[im 22/22]
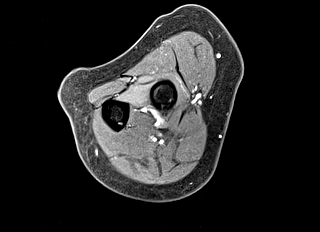

[Series 11: T1 fat-sat post-contrast · sagittal · 4.0mm · 0.50mm/px · 4 of 13 slices shown (2 of 2)]
[im 1/13]
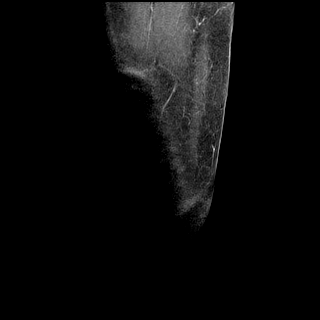
[im 5/13]
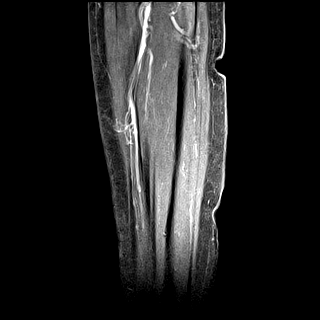
[im 9/13]
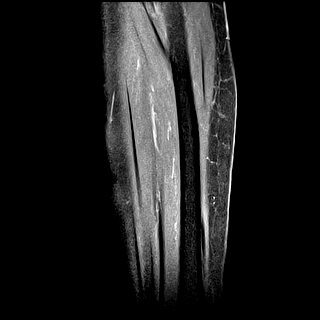
[im 13/13]
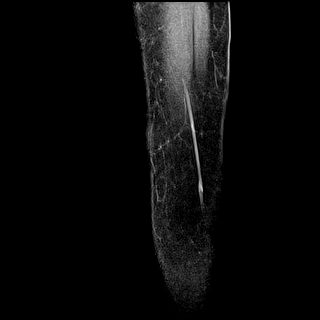

[38 of 40 positions shown; findings below may reference images not displayed]

FINDINGS: There is prominent subcutaneous fat in the area of concern but there
is no discrete lipoma or other soft tissue mass. The underlying
muscles and bones appear normal. There is no pathologic enhancement
after contrast administration.
IMPRESSION: No evidence of soft tissue mass or other significant abnormality.
Prominent focal subcutaneous fat in the area of concern.

## 2017-11-07 DIAGNOSIS — J0110 Acute frontal sinusitis, unspecified: Secondary | ICD-10-CM | POA: Diagnosis not present

## 2017-11-07 DIAGNOSIS — R42 Dizziness and giddiness: Secondary | ICD-10-CM | POA: Diagnosis not present

## 2017-11-07 DIAGNOSIS — R635 Abnormal weight gain: Secondary | ICD-10-CM | POA: Diagnosis not present

## 2017-11-21 DIAGNOSIS — R55 Syncope and collapse: Secondary | ICD-10-CM | POA: Diagnosis not present

## 2017-11-21 DIAGNOSIS — R102 Pelvic and perineal pain: Secondary | ICD-10-CM | POA: Diagnosis not present

## 2017-11-21 DIAGNOSIS — R110 Nausea: Secondary | ICD-10-CM | POA: Diagnosis not present

## 2017-11-24 DIAGNOSIS — R110 Nausea: Secondary | ICD-10-CM | POA: Diagnosis not present

## 2017-11-28 ENCOUNTER — Other Ambulatory Visit: Payer: Self-pay | Admitting: Physician Assistant

## 2017-11-28 DIAGNOSIS — R110 Nausea: Secondary | ICD-10-CM | POA: Diagnosis not present

## 2017-11-30 ENCOUNTER — Ambulatory Visit
Admission: RE | Admit: 2017-11-30 | Discharge: 2017-11-30 | Disposition: A | Discharge status: Home / Self Care | Payer: PPO | Source: Ambulatory Visit | Attending: Physician Assistant | Admitting: Physician Assistant

## 2017-11-30 DIAGNOSIS — R110 Nausea: Secondary | ICD-10-CM

## 2017-11-30 DIAGNOSIS — K449 Diaphragmatic hernia without obstruction or gangrene: Secondary | ICD-10-CM | POA: Diagnosis not present

## 2018-01-17 DIAGNOSIS — L821 Other seborrheic keratosis: Secondary | ICD-10-CM | POA: Diagnosis not present

## 2018-01-17 DIAGNOSIS — D225 Melanocytic nevi of trunk: Secondary | ICD-10-CM | POA: Diagnosis not present

## 2018-01-17 DIAGNOSIS — I788 Other diseases of capillaries: Secondary | ICD-10-CM | POA: Diagnosis not present

## 2018-01-17 DIAGNOSIS — L814 Other melanin hyperpigmentation: Secondary | ICD-10-CM | POA: Diagnosis not present

## 2018-02-14 DIAGNOSIS — R110 Nausea: Secondary | ICD-10-CM | POA: Diagnosis not present

## 2018-02-14 DIAGNOSIS — Z79899 Other long term (current) drug therapy: Secondary | ICD-10-CM | POA: Diagnosis not present

## 2018-02-14 DIAGNOSIS — E7800 Pure hypercholesterolemia, unspecified: Secondary | ICD-10-CM | POA: Diagnosis not present

## 2018-02-14 DIAGNOSIS — I10 Essential (primary) hypertension: Secondary | ICD-10-CM | POA: Diagnosis not present

## 2018-02-14 DIAGNOSIS — E213 Hyperparathyroidism, unspecified: Secondary | ICD-10-CM | POA: Diagnosis not present

## 2018-02-14 DIAGNOSIS — K219 Gastro-esophageal reflux disease without esophagitis: Secondary | ICD-10-CM | POA: Diagnosis not present

## 2018-02-14 DIAGNOSIS — Z1389 Encounter for screening for other disorder: Secondary | ICD-10-CM | POA: Diagnosis not present

## 2018-02-14 DIAGNOSIS — Z0000 Encounter for general adult medical examination without abnormal findings: Secondary | ICD-10-CM | POA: Diagnosis not present

## 2018-03-29 DIAGNOSIS — H5213 Myopia, bilateral: Secondary | ICD-10-CM | POA: Diagnosis not present

## 2018-03-29 DIAGNOSIS — H524 Presbyopia: Secondary | ICD-10-CM | POA: Diagnosis not present

## 2018-04-21 ENCOUNTER — Other Ambulatory Visit: Payer: Self-pay | Admitting: Physician Assistant

## 2018-04-21 DIAGNOSIS — K219 Gastro-esophageal reflux disease without esophagitis: Secondary | ICD-10-CM | POA: Diagnosis not present

## 2018-04-21 DIAGNOSIS — R110 Nausea: Secondary | ICD-10-CM

## 2018-04-28 ENCOUNTER — Ambulatory Visit
Admission: RE | Admit: 2018-04-28 | Discharge: 2018-04-28 | Disposition: A | Discharge status: Home / Self Care | Payer: PPO | Source: Ambulatory Visit | Attending: Physician Assistant | Admitting: Physician Assistant

## 2018-04-28 DIAGNOSIS — K8020 Calculus of gallbladder without cholecystitis without obstruction: Secondary | ICD-10-CM | POA: Diagnosis not present

## 2018-04-28 DIAGNOSIS — R110 Nausea: Secondary | ICD-10-CM

## 2018-05-12 DIAGNOSIS — N281 Cyst of kidney, acquired: Secondary | ICD-10-CM | POA: Diagnosis not present

## 2018-05-12 DIAGNOSIS — R110 Nausea: Secondary | ICD-10-CM | POA: Diagnosis not present

## 2018-05-12 DIAGNOSIS — K8020 Calculus of gallbladder without cholecystitis without obstruction: Secondary | ICD-10-CM | POA: Diagnosis not present

## 2018-05-18 DIAGNOSIS — R110 Nausea: Secondary | ICD-10-CM | POA: Diagnosis not present

## 2018-05-18 DIAGNOSIS — K8020 Calculus of gallbladder without cholecystitis without obstruction: Secondary | ICD-10-CM | POA: Diagnosis not present

## 2018-05-18 DIAGNOSIS — K219 Gastro-esophageal reflux disease without esophagitis: Secondary | ICD-10-CM | POA: Diagnosis not present

## 2018-05-18 DIAGNOSIS — Z23 Encounter for immunization: Secondary | ICD-10-CM | POA: Diagnosis not present

## 2018-06-07 DIAGNOSIS — R300 Dysuria: Secondary | ICD-10-CM | POA: Diagnosis not present

## 2018-07-18 DIAGNOSIS — R300 Dysuria: Secondary | ICD-10-CM | POA: Diagnosis not present

## 2018-09-20 ENCOUNTER — Encounter (HOSPITAL_COMMUNITY): Payer: Self-pay | Admitting: Emergency Medicine

## 2018-09-20 ENCOUNTER — Emergency Department (HOSPITAL_COMMUNITY): Payer: PPO

## 2018-09-20 ENCOUNTER — Observation Stay (HOSPITAL_COMMUNITY): Payer: PPO

## 2018-09-20 ENCOUNTER — Observation Stay (HOSPITAL_COMMUNITY)
Admission: EM | Admit: 2018-09-20 | Discharge: 2018-09-21 | Disposition: A | Discharge status: Home / Self Care | Payer: PPO | Attending: Internal Medicine | Admitting: Internal Medicine

## 2018-09-20 DIAGNOSIS — K219 Gastro-esophageal reflux disease without esophagitis: Secondary | ICD-10-CM | POA: Diagnosis not present

## 2018-09-20 DIAGNOSIS — Z79899 Other long term (current) drug therapy: Secondary | ICD-10-CM | POA: Insufficient documentation

## 2018-09-20 DIAGNOSIS — G459 Transient cerebral ischemic attack, unspecified: Secondary | ICD-10-CM | POA: Diagnosis not present

## 2018-09-20 DIAGNOSIS — F419 Anxiety disorder, unspecified: Secondary | ICD-10-CM | POA: Diagnosis not present

## 2018-09-20 DIAGNOSIS — E1159 Type 2 diabetes mellitus with other circulatory complications: Secondary | ICD-10-CM

## 2018-09-20 DIAGNOSIS — R29818 Other symptoms and signs involving the nervous system: Secondary | ICD-10-CM | POA: Diagnosis not present

## 2018-09-20 DIAGNOSIS — R29810 Facial weakness: Secondary | ICD-10-CM | POA: Diagnosis not present

## 2018-09-20 DIAGNOSIS — I10 Essential (primary) hypertension: Secondary | ICD-10-CM | POA: Diagnosis not present

## 2018-09-20 DIAGNOSIS — R200 Anesthesia of skin: Secondary | ICD-10-CM | POA: Diagnosis not present

## 2018-09-20 DIAGNOSIS — R413 Other amnesia: Secondary | ICD-10-CM

## 2018-09-20 LAB — APTT: aPTT: 30 seconds (ref 24–36)

## 2018-09-20 LAB — PROTIME-INR
INR: 1.07
Prothrombin Time: 13.8 seconds (ref 11.4–15.2)

## 2018-09-20 LAB — COMPREHENSIVE METABOLIC PANEL
ALT: 12 U/L (ref 0–44)
AST: 21 U/L (ref 15–41)
Albumin: 3.6 g/dL (ref 3.5–5.0)
Alkaline Phosphatase: 76 U/L (ref 38–126)
Anion gap: 7 (ref 5–15)
BUN: 9 mg/dL (ref 8–23)
CO2: 25 mmol/L (ref 22–32)
Calcium: 10 mg/dL (ref 8.9–10.3)
Chloride: 103 mmol/L (ref 98–111)
Creatinine, Ser: 0.74 mg/dL (ref 0.44–1.00)
GFR calc Af Amer: >60 mL/min (ref >60)
GFR calc non Af Amer: >60 mL/min (ref >60)
Glucose, Bld: 109 mg/dL — ABNORMAL HIGH (ref 70–99)
Potassium: 4.2 mmol/L (ref 3.5–5.1)
Sodium: 135 mmol/L (ref 135–145)
Total Bilirubin: 0.8 mg/dL (ref 0.3–1.2)
Total Protein: 6.5 g/dL (ref 6.5–8.1)

## 2018-09-20 LAB — CBC
HCT: 41.5 % (ref 36.0–46.0)
Hemoglobin: 13.9 g/dL (ref 12.0–15.0)
MCH: 31.4 pg (ref 26.0–34.0)
MCHC: 33.5 g/dL (ref 30.0–36.0)
MCV: 93.7 fL (ref 80.0–100.0)
Platelets: 270 10*3/uL (ref 150–400)
RBC: 4.43 MIL/uL (ref 3.87–5.11)
RDW: 11.9 % (ref 11.5–15.5)
WBC: 7.5 10*3/uL (ref 4.0–10.5)
nRBC: 0 % (ref 0.0–0.2)

## 2018-09-20 LAB — URINALYSIS, ROUTINE W REFLEX MICROSCOPIC
Bilirubin Urine: NEGATIVE
Glucose, UA: NEGATIVE mg/dL
Hgb urine dipstick: NEGATIVE
Ketones, ur: NEGATIVE mg/dL
Leukocytes, UA: NEGATIVE
Nitrite: NEGATIVE
Protein, ur: NEGATIVE mg/dL
Specific Gravity, Urine: 1.006 (ref 1.005–1.030)
pH: 7 (ref 5.0–8.0)

## 2018-09-20 LAB — DIFFERENTIAL
Abs Immature Granulocytes: 0.02 10*3/uL (ref 0.00–0.07)
Basophils Absolute: 0.1 10*3/uL (ref 0.0–0.1)
Basophils Relative: 1 %
Eosinophils Absolute: 0.2 10*3/uL (ref 0.0–0.5)
Eosinophils Relative: 2 %
Immature Granulocytes: 0 %
Lymphocytes Relative: 29 %
Lymphs Abs: 2.1 10*3/uL (ref 0.7–4.0)
Monocytes Absolute: 0.8 10*3/uL (ref 0.1–1.0)
Monocytes Relative: 11 %
Neutro Abs: 4.3 10*3/uL (ref 1.7–7.7)
Neutrophils Relative %: 57 %

## 2018-09-20 LAB — I-STAT TROPONIN, ED: Troponin i, poc: 0 ng/mL (ref 0.00–0.08)

## 2018-09-20 MED ORDER — ATORVASTATIN CALCIUM 10 MG PO TABS: 20.0000 mg | ORAL_TABLET | Freq: Every day | ORAL | Status: DC

## 2018-09-20 MED ORDER — ENOXAPARIN SODIUM 40 MG/0.4ML ~~LOC~~ SOLN
40.0000 mg | SUBCUTANEOUS | Status: DC
  Administered 2018-09-21: 40 mg via SUBCUTANEOUS
  Filled 2018-09-20 (×2): qty 0.4

## 2018-09-20 MED ORDER — PANTOPRAZOLE SODIUM 40 MG PO TBEC
40.0000 mg | DELAYED_RELEASE_TABLET | Freq: Every day | ORAL | Status: DC
  Administered 2018-09-20 – 2018-09-21 (×2): 40 mg via ORAL
  Filled 2018-09-20 (×2): qty 1

## 2018-09-20 MED ORDER — SENNOSIDES-DOCUSATE SODIUM 8.6-50 MG PO TABS: 1.0000 | ORAL_TABLET | Freq: Every evening | ORAL | Status: DC | PRN

## 2018-09-20 MED ORDER — STROKE: EARLY STAGES OF RECOVERY BOOK
Freq: Once | Status: AC
  Administered 2018-09-20: 18:00:00
  Filled 2018-09-20: qty 1

## 2018-09-20 MED ORDER — DOCUSATE SODIUM 100 MG PO CAPS: 100.0000 mg | ORAL_CAPSULE | Freq: Every day | ORAL | Status: DC | PRN

## 2018-09-20 MED ORDER — IOPAMIDOL (ISOVUE-370) INJECTION 76%
INTRAVENOUS | Status: AC
  Filled 2018-09-20: qty 50

## 2018-09-20 MED ORDER — ASPIRIN EC 81 MG PO TBEC
81.0000 mg | DELAYED_RELEASE_TABLET | Freq: Every day | ORAL | Status: DC
  Administered 2018-09-21: 81 mg via ORAL
  Filled 2018-09-20: qty 1

## 2018-09-20 MED ORDER — ACETAMINOPHEN 650 MG RE SUPP: 650.0000 mg | RECTAL | Status: DC | PRN

## 2018-09-20 MED ORDER — IOPAMIDOL (ISOVUE-370) INJECTION 76%
50.0000 mL | Freq: Once | INTRAVENOUS | Status: AC | PRN
  Administered 2018-09-20: 50 mL via INTRAVENOUS

## 2018-09-20 MED ORDER — CLOPIDOGREL BISULFATE 75 MG PO TABS
75.0000 mg | ORAL_TABLET | Freq: Every day | ORAL | Status: DC
  Administered 2018-09-21: 75 mg via ORAL
  Filled 2018-09-20: qty 1

## 2018-09-20 MED ORDER — DIAZEPAM 5 MG PO TABS: 5.0000 mg | ORAL_TABLET | Freq: Three times a day (TID) | ORAL | Status: DC | PRN

## 2018-09-20 MED ORDER — ACETAMINOPHEN 160 MG/5ML PO SOLN: 650.0000 mg | ORAL | Status: DC | PRN

## 2018-09-20 MED ORDER — ACETAMINOPHEN 325 MG PO TABS: 650.0000 mg | ORAL_TABLET | ORAL | Status: DC | PRN

## 2018-09-20 NOTE — ED Notes (Signed)
Patient ambulated around room with PA Geiple, stated she felt slightly off balance, steady gait.

## 2018-09-20 NOTE — ED Notes (Signed)
ED Provider at bedside. 

## 2018-09-20 NOTE — H&P (Signed)
[facility]                                                                                    Patient Demographics  Crystal Chang, is a 85 y.o. female  CSN: [id number]  MRN: [id number]  DOB - 09/27/1929  Admit Date - 09/20/2018  Outpatient Primary MD for the patient is Lajean Manes, MD   With History of -  Past Medical History:  Diagnosis Date  . Anxiety   . Bradycardia    due to PVC resoloved after PVC ablation  . Cystitis   . DJD (degenerative joint disease)   . GERD (gastroesophageal reflux disease)   . Hypertension   . NSVT (nonsustained ventricular tachycardia) ([place])   . PVC (premature ventricular contraction)       Past Surgical History:  Procedure Laterality Date  . KNEE SURGERY    . TOTAL ABDOMINAL HYSTERECTOMY      in for   Chief Complaint  Patient presents with  . Stroke Symptoms     HPI  Crystal Chang  is a 85 y.o. female, with no significant past medical history except for anxiety and degenerative joint disease presenting with an acute episode left facial droop, dysarthria, and left arm and leg numbness .  The episode lasted for around 15 minutes and resolved completely with persistent mild numbness on the left side.  No preceding chest pains, palpitations nausea or vomiting.  No similar episodes in the past.  CT of the head was negative, done in the emergency room.    Review of Systems    In addition to the HPI above,  No Fever-chills, No Headache, No changes with Vision or hearing, No problems swallowing food or Liquids, No Chest pain, Cough or Shortness of Breath, No Abdominal pain, No Nausea or Vommitting, Bowel movements are regular, No Blood in stool or Urine, No dysuria, No new skin rashes or bruises, No new joints pains-aches,  No recent weight gain or loss, No polyuria, polydypsia or polyphagia, No significant Mental Stressors.  A full 10 point Review of Systems was done, except as stated above, all other Review of  Systems were negative.   Social History Social History   Tobacco Use  . Smoking status: Never Smoker  Substance Use Topics  . Alcohol use: No     Family History Family History  Problem Relation Age of Onset  . Heart disease Father   . Heart disease Sister   . Heart attack Sister      Prior to Admission medications   Medication Sig Start Date End Date Taking? Authorizing Provider  calcium carbonate 200 MG capsule Take 250 mg by mouth 2 (two) times daily with a meal.   Yes [provider]  diazepam (VALIUM) 5 MG tablet Take 5 mg by mouth every 6 (six) hours as needed.   Yes [provider]  docusate sodium (COLACE) 100 MG capsule Take 100 mg by mouth daily as needed for mild constipation.   Yes [provider]  Multiple Vitamin (MULTI-VITAMIN PO) Take by mouth.   Yes [provider]  pantoprazole (PROTONIX) 40 MG tablet Take 40 mg by mouth  daily.   Yes [provider]  trolamine salicylate (ASPERCREME) 10 % cream Apply 1 application topically as needed (joint pain).   Yes [provider]  vitamin B-12 (CYANOCOBALAMIN) 1000 MCG tablet Take 1,000 mcg by mouth daily.   Yes [provider]    Allergies  Allergen Reactions  . Sulfonamide Derivatives     Physical Exam  Vitals  Blood pressure (!) 148/89, pulse 76, temperature 97.7 F (36.5 C), temperature source Oral, resp. rate 20, SpO2 98 %.   1. General well-developed, well-nourished extremely pleasant  2. Normal affect and insight, Not Suicidal or Homicidal, Awake Alert, Oriented X 3.  3. No F.N deficits, ALL C.Nerves Intact, Strength 5/5 all 4 extremities, Sensation intact all 4 extremities, Plantars down going, bilaterally.  4. Ears and Eyes appear Normal, Conjunctivae clear, PERRLA. Moist Oral Mucosa.  5. Supple Neck, No JVD, No cervical lymphadenopathy appriciated, No Carotid Bruits.  6. Symmetrical Chest wall movement, Good air movement bilaterally,  CTAB.  7. RRR, No Gallops, Rubs or Murmurs, No Parasternal Heave.  8. Positive Bowel Sounds, Abdomen Soft, Non tender, No organomegaly appriciated,No rebound -guarding or rigidity.  9.  No Cyanosis, Normal Skin Turgor, No Skin Rash or Bruise.  10. Good muscle tone,  joints appear normal , no effusions, Normal ROM.    Data Review  CBC Recent Labs  Lab 09/20/18 1120  WBC 7.5  HGB 13.9  HCT 41.5  PLT 270  MCV 93.7  MCH 31.4  MCHC 33.5  RDW 11.9  LYMPHSABS 2.1  MONOABS 0.8  EOSABS 0.2  BASOSABS 0.1   ------------------------------------------------------------------------------------------------------------------  Chemistries  Recent Labs  Lab 09/20/18 1120  NA 135  K 4.2  CL 103  CO2 25  GLUCOSE 109*  BUN 9  CREATININE 0.74  CALCIUM 10.0  AST 21  ALT 12  ALKPHOS 76  BILITOT 0.8   ------------------------------------------------------------------------------------------------------------------ CrCl cannot be calculated (Unknown ideal weight.). ------------------------------------------------------------------------------------------------------------------ No results for input(s): TSH, T4TOTAL, T3FREE, THYROIDAB in the last 72 hours.  Invalid input(s): FREET3   Coagulation profile Recent Labs  Lab 09/20/18 1120  INR 1.07   ------------------------------------------------------------------------------------------------------------------- No results for input(s): DDIMER in the last 72 hours. -------------------------------------------------------------------------------------------------------------------  Cardiac Enzymes No results for input(s): CKMB, TROPONINI, MYOGLOBIN in the last 168 hours.  Invalid input(s): CK ------------------------------------------------------------------------------------------------------------------ Invalid input(s):  POCBNP   ---------------------------------------------------------------------------------------------------------------  Urinalysis    Component Value Date/Time   COLORURINE YELLOW 09/20/2018 1101   APPEARANCEUR CLEAR 09/20/2018 1101   LABSPEC 1.006 09/20/2018 1101   PHURINE 7.0 09/20/2018 1101   GLUCOSEU NEGATIVE 09/20/2018 1101   HGBUR NEGATIVE 09/20/2018 1101   BILIRUBINUR NEGATIVE 09/20/2018 1101   KETONESUR NEGATIVE 09/20/2018 1101   PROTEINUR NEGATIVE 09/20/2018 1101   UROBILINOGEN 0.2 07/29/2009 0028   NITRITE NEGATIVE 09/20/2018 1101   Ama 09/20/2018 1101    ----------------------------------------------------------------------------------------------------------------  Imaging results:   Ct Head Wo Contrast  Result Date: 09/20/2018 CLINICAL DATA:  Facial droop EXAM: CT HEAD WITHOUT CONTRAST TECHNIQUE: Contiguous axial images were obtained from the base of the skull through the vertex without intravenous contrast. COMPARISON:  07/28/2009 FINDINGS: Brain: Mild atrophic changes and chronic white matter ischemic changes are seen. No findings to suggest acute hemorrhage, acute infarction or space-occupying mass lesion are noted. Vascular: No hyperdense vessel or unexpected calcification. Skull: Normal. Negative for fracture or focal lesion. Sinuses/Orbits: No acute finding. Other: None. IMPRESSION: Chronic atrophic and ischemic changes without acute abnormality. Electronically Signed   By: Inez Catalina M.D.   On:  09/20/2018 14:39    My personal review of EKG: Rhythm NSR, 71 bpm, no acute changes    Assessment & Plan   TIA Left-sided with complete resolution of her neuro deficits Will admit to neuro telemetry Neurochecks Consult neurology Lipid profile Hemoglobin A1c Check carotid Dopplers MRI/MRA  Anxiety Continue with Valium as needed and decreased to every 8  History of NSVT Monitor at telemetry  Hypertension Controlled on no  treatment     DVT Prophylaxis Lovenox  AM Labs Ordered, also please review Full Orders  Family Communication: Admission, patients condition and plan of care including tests being ordered have been discussed with the patient and daughter who indicate understanding and agree with the plan and Code Status.  Code Status full  Disposition Plan: Home  Time spent in minutes : 44 minutes  Condition GUARDED   @SIGNATURE @

## 2018-09-20 NOTE — ED Notes (Signed)
Admitting at bedside 

## 2018-09-20 NOTE — Progress Notes (Signed)
Pt arrived to 3W04. A&O x3. Daughter at bedside. POC has been discussed with patient. Nurse will continue to monitor.

## 2018-09-20 NOTE — ED Triage Notes (Signed)
Pt reports that she woke up fine this am, went to take a shower around 0700 and noticed L facial droop in the mirror and some numbness to her L arm and L leg. . Pt states that ems was called by her family, ems came and checked her out but did not insist on transporting her since her symptoms had resolved (around [phone]).  Pt a/ox4, speech clear, moves all limbs equally with equal grip strength, Face symmetrical.

## 2018-09-20 NOTE — ED Provider Notes (Signed)
[facility] EMERGENCY DEPARTMENT Provider Note   CSN: [id number] Arrival date & time: 09/20/18  1045     History   Chief Complaint Chief Complaint  Patient presents with  . Stroke Symptoms    HPI Crystal Chang is a 85 y.o. female.  With history of cardiac ablation presents the emergency department today with complaint of generalized weakness as well as an episode of left-sided facial droop with associated numbness in her left arm and left leg.  Symptoms started shortly before 8 AM.  EMS was called.  Patient was not transported to the hospital.  Her left-sided facial droop lasted only a few minutes and resolved by the time the EMS arrived to the scene.  Currently all symptoms are resolved except for some minimal left fingertip numbness.  Patient has generalized weakness, no focal weakness.  She denies any recent head injury, headache.  She feels dizzy when she is on her feet but is able to ambulate.  No vertigo.  Patient is able to talk without any difficulty.  No history of hypertension, high cholesterol, diabetes.  Patient does not smoke.  She has 2 sisters who have had strokes.  No personal history of stroke.  The onset of this condition was acute. The course is resolved. Aggravating factors: none. Alleviating factors: none.       Past Medical History:  Diagnosis Date  . Anxiety   . Bradycardia    due to PVC resoloved after PVC ablation  . Cystitis   . DJD (degenerative joint disease)   . GERD (gastroesophageal reflux disease)   . Hypertension   . NSVT (nonsustained ventricular tachycardia) ([place])   . PVC (premature ventricular contraction)     Patient Active Problem List   Diagnosis Date Noted  . BRADYCARDIA 07/09/2009  . VENTRICULAR TACHYCARDIA, PAROXYSMAL 06/27/2009  . HYPERTENSION 06/26/2009    Past Surgical History:  Procedure Laterality Date  . KNEE SURGERY    . TOTAL ABDOMINAL HYSTERECTOMY       OB History   No obstetric history on file.      Home Medications    Prior to Admission medications   Medication Sig Start Date End Date Taking? Authorizing Provider  calcium carbonate 200 MG capsule Take 250 mg by mouth 2 (two) times daily with a meal.    [provider]  diazepam (VALIUM) 5 MG tablet Take 5 mg by mouth every 6 (six) hours as needed.    [provider]  estradiol (ESTRACE) 0.1 MG/GM vaginal cream Place 2 g vaginally daily.    [provider]  Multiple Vitamin (MULTI-VITAMIN PO) Take by mouth.    [provider]  pantoprazole (PROTONIX) 40 MG tablet Take 40 mg by mouth daily.    [provider]  vitamin B-12 (CYANOCOBALAMIN) 1000 MCG tablet Take 1,000 mcg by mouth daily.    [provider]    Family History Family History  Problem Relation Age of Onset  . Heart disease Father   . Heart disease Sister   . Heart attack Sister     Social History Social History   Tobacco Use  . Smoking status: Never Smoker  Substance Use Topics  . Alcohol use: No  . Drug use: No     Allergies   Sulfonamide derivatives   Review of Systems Review of Systems  Constitutional: Negative for fever.  HENT: Negative for rhinorrhea and sore throat.   Eyes: Negative for redness.  Respiratory: Negative for cough and  shortness of breath.   Cardiovascular: Negative for chest pain.  Gastrointestinal: Negative for abdominal pain, diarrhea, nausea and vomiting.  Genitourinary: Negative for dysuria.  Musculoskeletal: Negative for myalgias.  Skin: Negative for rash.  Neurological: Positive for facial asymmetry, weakness (generalized) and numbness. Negative for headaches.     Physical Exam Updated Vital Signs BP (!) 153/68   Pulse 77   Temp 97.7 F (36.5 C) (Oral)   Resp 15   Physical Exam Vitals signs and nursing note reviewed.  Constitutional:      Appearance: She is well-developed.  HENT:     Head: Normocephalic and atraumatic.     Right Ear: Tympanic membrane,  ear canal and external ear normal.     Left Ear: Tympanic membrane, ear canal and external ear normal.     Nose: Nose normal.     Mouth/Throat:     Pharynx: Uvula midline.  Eyes:     General: Lids are normal.     Extraocular Movements:     Right eye: No nystagmus.     Left eye: No nystagmus.     Conjunctiva/sclera: Conjunctivae normal.     Pupils: Pupils are equal, round, and reactive to light.  Neck:     Musculoskeletal: Normal range of motion and neck supple.  Cardiovascular:     Rate and Rhythm: Normal rate and regular rhythm.  Pulmonary:     Effort: Pulmonary effort is normal.     Breath sounds: Normal breath sounds.  Abdominal:     Palpations: Abdomen is soft.     Tenderness: There is no abdominal tenderness.  Musculoskeletal:     Cervical back: She exhibits normal range of motion, no tenderness and no bony tenderness.  Skin:    General: Skin is warm and dry.  Neurological:     Mental Status: She is alert and oriented to person, place, and time.     GCS: GCS eye subscore is 4. GCS verbal subscore is 5. GCS motor subscore is 6.     Cranial Nerves: No cranial nerve deficit.     Sensory: No sensory deficit.     Motor: No weakness or pronator drift.     Coordination: Romberg sign positive. Coordination normal.     Gait: Gait normal.     Deep Tendon Reflexes: Reflexes are normal and symmetric.      ED Treatments / Results  Labs (all labs ordered are listed, but only abnormal results are displayed) Labs Reviewed  COMPREHENSIVE METABOLIC PANEL - Abnormal; Notable for the following components:      Result Value   Glucose, Bld 109 (*)    All other components within normal limits  PROTIME-INR  APTT  CBC  DIFFERENTIAL  URINALYSIS, ROUTINE W REFLEX MICROSCOPIC  I-STAT TROPONIN, ED    EKG EKG Interpretation  Date/Time:  Wednesday September 20 2018 10:59:39 EST Ventricular Rate:  71 PR Interval:    QRS Duration: 89 QT Interval:  383 QTC Calculation: 417 R  Axis:   12 Text Interpretation:  Sinus rhythm Low voltage, precordial leads No significant change since last tracing Confirmed by Duffy Bruce [phone]) on 09/20/2018 11:15:41 AM Also confirmed by Duffy Bruce [phone]), editor Philomena Doheny [phone])  on 09/20/2018 11:40:07 AM   Radiology Ct Head Wo Contrast  Result Date: 09/20/2018 CLINICAL DATA:  Facial droop EXAM: CT HEAD WITHOUT CONTRAST TECHNIQUE: Contiguous axial images were obtained from the base of the skull through the vertex without intravenous contrast. COMPARISON:  07/28/2009 FINDINGS: Brain: Mild  atrophic changes and chronic white matter ischemic changes are seen. No findings to suggest acute hemorrhage, acute infarction or space-occupying mass lesion are noted. Vascular: No hyperdense vessel or unexpected calcification. Skull: Normal. Negative for fracture or focal lesion. Sinuses/Orbits: No acute finding. Other: None. IMPRESSION: Chronic atrophic and ischemic changes without acute abnormality. Electronically Signed   By: Inez Catalina M.D.   On: 09/20/2018 14:39    Procedures Procedures (including critical care time)  Medications Ordered in ED Medications - No data to display   Initial Impression / Assessment and Plan / ED Course  I have reviewed the triage vital signs and the nursing notes.  Pertinent labs & imaging results that were available during my care of the patient were reviewed by me and considered in my medical decision making (see chart for details).     Patient seen and examined. Work-up initiated. No code stroke given minimal symptoms without any focal deficits on exam.  Concern for TIA.  Vital signs reviewed and are as follows: BP (!) 153/68   Pulse 77   Temp 97.7 F (36.5 C) (Oral)   Resp 15   3:11 PM Work-up negative to this point. Patient discussed with and seen by Dr. Ellender Hose. Will need admission given concern for TIA.   BP (!) 148/89   Pulse 93   Temp 97.7 F (36.5 C) (Oral)   Resp 15   SpO2 97%     3:15 PM Spoke with Dr. Laren Everts of [facility] who will see patient.   Final Clinical Impressions(s) / ED Diagnoses   Final diagnoses:  TIA (transient ischemic attack)   Admit for TIA evaluation.   ED Discharge Orders    None       Carlisle Cater, Hershal Coria 09/20/18 1515    Duffy Bruce, MD 09/21/18 [phone]

## 2018-09-20 NOTE — Consult Note (Addendum)
Neurology Consultation  Reason for Consult: TIA Referring Physician: Merton Border, MD  CC: Transient TIA symptoms  History is obtained from: Patient and family  HPI: Crystal Chang is a 85 y.o. female with history of PVC, NSVT, hypertension, bradycardia, Afib s/p ablation (~10 year ago) and anxiety.  Patient states that she got up around 430 and was on the computer this morning.  She went back to sleep.  Got up later and looked in the mirror noted that her face was drooping on the left.  Patient went to her husband who called her daughters.  Daughters came to the house and noted that there was a facial droop.  Last known normal was between 930-10:30 AM.  Symptoms last about 15 minutes and then fully resolved.  Major symptoms include a left facial droop, dysarthria, left arm weakness, left arm numbness, slight left leg weakness.  At this time all symptoms have resolved.  Patient does not take aspirin on a daily basis.  Patient has not had any symptoms such as above.  Patient did not have any nausea vomiting, chest pain, diplopia, blurred vision, visual field cuts, expressive or receptive aphasia   ED course-labs, CT head  LKW: 930-10:30 AM this morning on 09/20/2018 tpa given?: no, symptoms fully resolved Premorbid modified Rankin scale (mRS): 0 NIH stroke scale of 0   ROS: A 14 point ROS was performed and is negative except as noted in the HPI.  Past Medical History:  Diagnosis Date  . Anxiety   . Bradycardia    due to PVC resoloved after PVC ablation  . Cystitis   . DJD (degenerative joint disease)   . GERD (gastroesophageal reflux disease)   . Hypertension   . NSVT (nonsustained ventricular tachycardia) ([place])   . PVC (premature ventricular contraction)      Family History  Problem Relation Age of Onset  . Heart disease Father   . Heart disease Sister   . Heart attack Sister      Social History:   reports that she has never smoked. She does not have any smokeless tobacco  history on file. She reports that she does not drink alcohol or use drugs.  Medications  Current Facility-Administered Medications:  .   stroke: mapping our early stages of recovery book, , Does not apply, Once, Merton Border, MD .  acetaminophen (TYLENOL) tablet 650 mg, 650 mg, Oral, Q4H PRN **OR** acetaminophen (TYLENOL) solution 650 mg, 650 mg, Per Tube, Q4H PRN **OR** acetaminophen (TYLENOL) suppository 650 mg, 650 mg, Rectal, Q4H PRN, Merton Border, MD .  diazepam (VALIUM) tablet 5 mg, 5 mg, Oral, Q8H PRN, Merton Border, MD .  docusate sodium (COLACE) capsule 100 mg, 100 mg, Oral, Daily PRN, Merton Border, MD .  Derrill Memo ON 09/21/2018] enoxaparin (LOVENOX) injection 40 mg, 40 mg, Subcutaneous, Q24H, Hijazi, Ali, MD .  pantoprazole (PROTONIX) EC tablet 40 mg, 40 mg, Oral, Daily, Hijazi, Deatra Canter, MD .  senna-docusate (Senokot-S) tablet 1 tablet, 1 tablet, Oral, QHS PRN, Merton Border, MD  Current Outpatient Medications:  .  calcium carbonate 200 MG capsule, Take 250 mg by mouth 2 (two) times daily with a meal., Disp: , Rfl:  .  diazepam (VALIUM) 5 MG tablet, Take 5 mg by mouth every 6 (six) hours as needed., Disp: , Rfl:  .  docusate sodium (COLACE) 100 MG capsule, Take 100 mg by mouth daily as needed for mild constipation., Disp: , Rfl:  .  Multiple Vitamin (MULTI-VITAMIN PO), Take by mouth., Disp: ,  Rfl:  .  pantoprazole (PROTONIX) 40 MG tablet, Take 40 mg by mouth daily., Disp: , Rfl:  .  trolamine salicylate (ASPERCREME) 10 % cream, Apply 1 application topically as needed (joint pain)., Disp: , Rfl:  .  vitamin B-12 (CYANOCOBALAMIN) 1000 MCG tablet, Take 1,000 mcg by mouth daily., Disp: , Rfl:    Exam: Current vital signs: BP 137/71   Pulse 85   Temp 97.7 F (36.5 C) (Oral)   Resp (!) 25   SpO2 96%  Vital signs in last 24 hours: Temp:  [97.7 F (36.5 C)] 97.7 F (36.5 C) (01/29 1101) Pulse Rate:  [66-93] 85 (01/29 1630) Resp:  [14-25] 25 (01/29 1630) BP: (125-153)/(62-89) 137/71 (01/29  1630) SpO2:  [96 %-100 %] 96 % (01/29 1630)  Physical Exam  Constitutional: Appears well-developed and well-nourished.  Psych: Affect appropriate to situation Eyes: No scleral injection HENT: No OP obstrucion Head: Normocephalic.  Cardiovascular: Normal rate and regular rhythm.  Respiratory: Effort normal, non-labored breathing GI: Soft.  No distension. There is no tenderness.  Skin: WDI  Neuro: Mental Status: Patient is awake, alert, oriented to person, place, month, year, and situation. Patient is able to give a clear and coherent history. No signs of aphasia or neglect Cranial Nerves: II: Visual Fields are full. III,IV, VI: EOMI without ptosis or diploplia.  Pupils are equal, round, and reactive to light.   V: Facial sensation is symmetric to temperature VII: Facial movement is symmetric.  VIII: hearing is intact to voice X: Uvula elevates symmetrically XI: Shoulder shrug is symmetric. XII: tongue is midline without atrophy or fasciculations.  Motor: Tone is normal. Bulk is normal. 5/5 strength was present in all four extremities.  Sensory: Sensation is symmetric to light touch and temperature in the arms and legs. Deep Tendon Reflexes: 2+ and symmetric bilateral upper extremities with no Achilles jerk Plantars: Bilaterally Cerebellar: FNF and HKS are intact bilaterally   Labs I have reviewed labs in epic and the results pertinent to this consultation are:   CBC    Component Value Date/Time   WBC 7.5 09/20/2018 1120   RBC 4.43 09/20/2018 1120   HGB 13.9 09/20/2018 1120   HCT 41.5 09/20/2018 1120   PLT 270 09/20/2018 1120   MCV 93.7 09/20/2018 1120   MCH 31.4 09/20/2018 1120   MCHC 33.5 09/20/2018 1120   RDW 11.9 09/20/2018 1120   LYMPHSABS 2.1 09/20/2018 1120   MONOABS 0.8 09/20/2018 1120   EOSABS 0.2 09/20/2018 1120   BASOSABS 0.1 09/20/2018 1120    CMP     Component Value Date/Time   NA 135 09/20/2018 1120   K 4.2 09/20/2018 1120   CL 103  09/20/2018 1120   CO2 25 09/20/2018 1120   GLUCOSE 109 (H) 09/20/2018 1120   BUN 9 09/20/2018 1120   CREATININE 0.74 09/20/2018 1120   CALCIUM 10.0 09/20/2018 1120   PROT 6.5 09/20/2018 1120   ALBUMIN 3.6 09/20/2018 1120   AST 21 09/20/2018 1120   ALT 12 09/20/2018 1120   ALKPHOS 76 09/20/2018 1120   BILITOT 0.8 09/20/2018 1120   GFRNONAA >60 09/20/2018 1120   GFRAA >60 09/20/2018 1120    Imaging I have reviewed the images obtained:  CT-scan of the brain- chronic atrophic and ischemic changes without acute abnormality  MRI examination of the brain-to be obtained  Crystal Quill PA-C [facility] [phone]  M-F  (9:00 am- 5:00 PM)  09/20/2018, 4:35 PM   ATTENDING ADDENDUM Seen and examined for less  than an hour worth of left facial weakness, left arm and leg this morning. Symptoms have resolved. No prior h/o of such symptoms. Exam NIHSS - 0 now.  Assessment:  85 year old female with what mostly is appreciated as a TIA incident that last approximately 15 minutes with left facial droop, left arm and leg weakness and left arm numbness.  Currently back to baseline.  Has risk factors for  Stroke/TIA and would benefit from work up.  Impression: -TIA  Recommendations: #CTA of head and neck #MRI Head and neck  #Transthoracic Echo, # Start patient on ASA 81 mg daily  #Start or continue Atorvastatin 80 mg/other high intensity statin # BP goal: permissive HTN upto 220/120 mmHg # HBAIC and Lipid profile # Telemetry monitoring # Frequent neuro checks # NPO until passes stroke swallow screen # Consider long term cardiac monitoring due to h/o Afib, s/p ablation.   # please page stroke NP  Or  PA  Or MD from 8am -4 pm  as this patient from this time will be  followed by the stroke.   You can look them up on [email or web address]  Password [email or web address]  -- Amie Portland, MD [facility] Pager: [phone] If 7pm to 7am, please call on call as listed on AMION.

## 2018-09-20 NOTE — ED Notes (Signed)
Family at bedside. 

## 2018-09-21 ENCOUNTER — Other Ambulatory Visit: Payer: Self-pay | Admitting: Physician Assistant

## 2018-09-21 ENCOUNTER — Observation Stay (HOSPITAL_BASED_OUTPATIENT_CLINIC_OR_DEPARTMENT_OTHER): Payer: PPO

## 2018-09-21 DIAGNOSIS — G459 Transient cerebral ischemic attack, unspecified: Secondary | ICD-10-CM | POA: Diagnosis not present

## 2018-09-21 DIAGNOSIS — I4729 Other ventricular tachycardia: Secondary | ICD-10-CM

## 2018-09-21 DIAGNOSIS — I493 Ventricular premature depolarization: Secondary | ICD-10-CM | POA: Diagnosis not present

## 2018-09-21 DIAGNOSIS — R413 Other amnesia: Secondary | ICD-10-CM | POA: Diagnosis not present

## 2018-09-21 DIAGNOSIS — I639 Cerebral infarction, unspecified: Secondary | ICD-10-CM

## 2018-09-21 DIAGNOSIS — E1159 Type 2 diabetes mellitus with other circulatory complications: Secondary | ICD-10-CM | POA: Diagnosis not present

## 2018-09-21 DIAGNOSIS — I472 Ventricular tachycardia: Secondary | ICD-10-CM

## 2018-09-21 LAB — HEMOGLOBIN A1C
Hgb A1c MFr Bld: 5.7 % — ABNORMAL HIGH (ref 4.8–5.6)
Mean Plasma Glucose: 116.89 mg/dL

## 2018-09-21 LAB — LIPID PANEL
Cholesterol: 212 mg/dL — ABNORMAL HIGH (ref 0–200)
HDL: 56 mg/dL (ref >40)
LDL Cholesterol: 137 mg/dL — ABNORMAL HIGH (ref 0–99)
Total CHOL/HDL Ratio: 3.8 RATIO
Triglycerides: 93 mg/dL (ref <150)
VLDL: 19 mg/dL (ref 0–40)

## 2018-09-21 LAB — ECHOCARDIOGRAM COMPLETE

## 2018-09-21 MED ORDER — ASPIRIN 81 MG PO TBEC: 81.0000 mg | DELAYED_RELEASE_TABLET | Freq: Every day | ORAL | 0 refills | Status: DC

## 2018-09-21 MED ORDER — ATORVASTATIN CALCIUM 80 MG PO TABS: 80.0000 mg | ORAL_TABLET | Freq: Every day | ORAL | Status: DC

## 2018-09-21 MED ORDER — ATORVASTATIN CALCIUM 40 MG PO TABS: 40.0000 mg | ORAL_TABLET | Freq: Every day | ORAL | Status: DC

## 2018-09-21 MED ORDER — ATORVASTATIN CALCIUM 40 MG PO TABS: 40.0000 mg | ORAL_TABLET | Freq: Every day | ORAL | 0 refills | Status: DC

## 2018-09-21 MED ORDER — CLOPIDOGREL BISULFATE 75 MG PO TABS: 75.0000 mg | ORAL_TABLET | Freq: Every day | ORAL | 0 refills | Status: DC

## 2018-09-21 NOTE — Progress Notes (Addendum)
STROKE TEAM PROGRESS NOTE   INTERVAL HISTORY Her 2 daughters and husband is at the bedside.  She feels she is back to baseline. She and daughters feel she has been having progressive trouble with her memory over the past 1 year and would like her evaluated. She does not have hx of AF but is willing to f/u w Dr. Rayann Heman.  Vitals:   09/20/18 2100 09/20/18 2300 09/20/18 2317 09/21/18 0306  BP: (!) 145/74 124/67 (!) 155/72 116/70  Pulse:   71 73  Resp: 18 18 18 18   Temp: 97.6 F (36.4 C) 98.2 F (36.8 C) (!) 97.5 F (36.4 C) 97.7 F (36.5 C)  TempSrc: Oral Oral Oral Oral  SpO2: 97% 94% 97% 96%    CBC:  Recent Labs  Lab 09/20/18 1120  WBC 7.5  NEUTROABS 4.3  HGB 13.9  HCT 41.5  MCV 93.7  PLT 628    Basic Metabolic Panel:  Recent Labs  Lab 09/20/18 1120  NA 135  K 4.2  CL 103  CO2 25  GLUCOSE 109*  BUN 9  CREATININE 0.74  CALCIUM 10.0   Lipid Panel:     Component Value Date/Time   CHOL 212 (H) 09/21/2018 0453   TRIG 93 09/21/2018 0453   HDL 56 09/21/2018 0453   CHOLHDL 3.8 09/21/2018 0453   VLDL 19 09/21/2018 0453   LDLCALC 137 (H) 09/21/2018 0453   HgbA1c:  Lab Results  Component Value Date   HGBA1C 5.7 (H) 09/21/2018   Urine Drug Screen: No results found for: LABOPIA, COCAINSCRNUR, LABBENZ, AMPHETMU, THCU, LABBARB  Alcohol Level No results found for: ETH  IMAGING Ct Head Wo Contrast  Result Date: 09/20/2018 CLINICAL DATA:  Facial droop EXAM: CT HEAD WITHOUT CONTRAST TECHNIQUE: Contiguous axial images were obtained from the base of the skull through the vertex without intravenous contrast. COMPARISON:  07/28/2009 FINDINGS: Brain: Mild atrophic changes and chronic white matter ischemic changes are seen. No findings to suggest acute hemorrhage, acute infarction or space-occupying mass lesion are noted. Vascular: No hyperdense vessel or unexpected calcification. Skull: Normal. Negative for fracture or focal lesion. Sinuses/Orbits: No acute finding. Other: None.  IMPRESSION: Chronic atrophic and ischemic changes without acute abnormality. Electronically Signed   By: Inez Catalina M.D.   On: 09/20/2018 14:39   Ct Angio Neck W Or Wo Contrast  Result Date: 09/20/2018 CLINICAL DATA:  85 y/o F; acute transit episode of left facial droop, dysarthria, and left extremity numbness. Mild residual left-sided numbness. EXAM: CT ANGIOGRAPHY NECK TECHNIQUE: Multidetector CT imaging of the neck was performed using the standard protocol during bolus administration of intravenous contrast. Multiplanar CT image reconstructions and MIPs were obtained to evaluate the vascular anatomy. Carotid stenosis measurements (when applicable) are obtained utilizing NASCET criteria, using the distal internal carotid diameter as the denominator. CONTRAST:  20mL ISOVUE-370 IOPAMIDOL (ISOVUE-370) INJECTION 76% COMPARISON:  09/20/2018 MRI and MRA of the head. 09/20/2018 CT of the head. FINDINGS: Aortic arch: Standard branching. Imaged portion shows no evidence of aneurysm or dissection. No significant stenosis of the major arch vessel origins. Right carotid system: No evidence of dissection, stenosis (50% or greater) or occlusion. Left carotid system: No evidence of dissection, stenosis (50% or greater) or occlusion. Vertebral arteries: Left dominant. No evidence of dissection, stenosis (50% or greater) or occlusion. Skeleton: C3-4 grade 1 anterolisthesis. Moderate spondylosis of the cervical spine with multilevel disc and facet degenerative changes. No high-grade bony spinal canal stenosis. Other neck: Negative. Upper chest: Negative. IMPRESSION: Patent  carotid and vertebral arteries. No dissection, aneurysm, or hemodynamically significant stenosis utilizing NASCET criteria. Electronically Signed   By: Kristine Garbe M.D.   On: 09/20/2018 22:36   Mr Brain Wo Contrast  Result Date: 09/20/2018 CLINICAL DATA:  15 minutes episode LEFT facial droop, dysarthria and LEFT extremity numbness. History  of hypertension. EXAM: MRI HEAD WITHOUT CONTRAST MRA HEAD WITHOUT CONTRAST TECHNIQUE: Multiplanar, multiecho pulse sequences of the brain and surrounding structures were obtained without intravenous contrast. Angiographic images of the head were obtained using MRA technique without contrast. COMPARISON:  CT HEAD September 20, 2018 and CT HEAD June 17, 2015 FINDINGS: MRI HEAD FINDINGS INTRACRANIAL CONTENTS: No reduced diffusion to suggest acute ischemia. LEFT occipital chronic microhemorrhage and extra-axial hemosiderin staining. No parenchymal brain volume loss for age. Patchy to confluent supratentorial white matter FLAIR T2 hyperintensities. Old small cerebellar infarcts. No hydrocephalus. No suspicious parenchymal signal, masses, mass effect. No abnormal extra-axial fluid collections. No extra-axial masses. VASCULAR: Normal major intracranial vascular flow voids present at skull base. SKULL AND UPPER CERVICAL SPINE: No abnormal sellar expansion. No suspicious calvarial bone marrow signal. Craniocervical junction maintained. SINUSES/ORBITS: The mastoid air-cells and included paranasal sinuses are well-aerated.The included ocular globes and orbital contents are non-suspicious. Status post bilateral ocular lens implants. OTHER: None. MRA HEAD FINDINGS-moderately motion degraded examination resulting in duplicated appearance of mid to distal vessels. ANTERIOR CIRCULATION: Normal flow related enhancement of the included cervical, petrous, cavernous and supraclinoid internal carotid arteries. Patent anterior communicating artery. Patent anterior and middle cerebral arteries. No large vessel occlusion, flow limiting stenosis, aneurysm. POSTERIOR CIRCULATION: LEFT vertebral artery is dominant. Vertebrobasilar arteries are patent, with normal flow related enhancement of the main branch vessels. Patent posterior cerebral arteries. Severe RIGHT P3 stenosis versus flow artifact. Robust LEFT posterior communicating artery  present. No large vessel occlusion, aneurysm. ANATOMIC VARIANTS: None. Source images and MIP images were reviewed. IMPRESSION: MRI HEAD: 1. No acute intracranial process. 2. Moderate chronic small vessel ischemic changes. Old small cerebellar infarcts. 3. Stable LEFT occipital superficial siderosis. MRA HEAD: 1. Motion degraded examination. No emergent large vessel occlusion. 2. Severe RIGHT P3 stenosis versus flow artifact. Electronically Signed   By: Elon Alas M.D.   On: 09/20/2018 20:57   Mr Jodene Nam Head Wo Contrast  Result Date: 09/20/2018 CLINICAL DATA:  15 minutes episode LEFT facial droop, dysarthria and LEFT extremity numbness. History of hypertension. EXAM: MRI HEAD WITHOUT CONTRAST MRA HEAD WITHOUT CONTRAST TECHNIQUE: Multiplanar, multiecho pulse sequences of the brain and surrounding structures were obtained without intravenous contrast. Angiographic images of the head were obtained using MRA technique without contrast. COMPARISON:  CT HEAD September 20, 2018 and CT HEAD June 17, 2015 FINDINGS: MRI HEAD FINDINGS INTRACRANIAL CONTENTS: No reduced diffusion to suggest acute ischemia. LEFT occipital chronic microhemorrhage and extra-axial hemosiderin staining. No parenchymal brain volume loss for age. Patchy to confluent supratentorial white matter FLAIR T2 hyperintensities. Old small cerebellar infarcts. No hydrocephalus. No suspicious parenchymal signal, masses, mass effect. No abnormal extra-axial fluid collections. No extra-axial masses. VASCULAR: Normal major intracranial vascular flow voids present at skull base. SKULL AND UPPER CERVICAL SPINE: No abnormal sellar expansion. No suspicious calvarial bone marrow signal. Craniocervical junction maintained. SINUSES/ORBITS: The mastoid air-cells and included paranasal sinuses are well-aerated.The included ocular globes and orbital contents are non-suspicious. Status post bilateral ocular lens implants. OTHER: None. MRA HEAD FINDINGS-moderately  motion degraded examination resulting in duplicated appearance of mid to distal vessels. ANTERIOR CIRCULATION: Normal flow related enhancement of the included cervical, petrous, cavernous  and supraclinoid internal carotid arteries. Patent anterior communicating artery. Patent anterior and middle cerebral arteries. No large vessel occlusion, flow limiting stenosis, aneurysm. POSTERIOR CIRCULATION: LEFT vertebral artery is dominant. Vertebrobasilar arteries are patent, with normal flow related enhancement of the main branch vessels. Patent posterior cerebral arteries. Severe RIGHT P3 stenosis versus flow artifact. Robust LEFT posterior communicating artery present. No large vessel occlusion, aneurysm. ANATOMIC VARIANTS: None. Source images and MIP images were reviewed. IMPRESSION: MRI HEAD: 1. No acute intracranial process. 2. Moderate chronic small vessel ischemic changes. Old small cerebellar infarcts. 3. Stable LEFT occipital superficial siderosis. MRA HEAD: 1. Motion degraded examination. No emergent large vessel occlusion. 2. Severe RIGHT P3 stenosis versus flow artifact. Electronically Signed   By: Elon Alas M.D.   On: 09/20/2018 20:57   2D Echocardiogram   1. The left ventricle has 60-65%. The cavity size is normal. There is normal left ventricular hypertrophy. Echo evidence of impaired relaxation diastolic filling patterns. Normal left ventricular filling pressures.  2. Normal left atrial size.  3. Normal right atrial size.  4. Normal tricuspid valve.  5. The aortic valve normal. There is mild thickening and mild calcification of the aortic valve.  6. No atrial level shunt detected by color flow Doppler.  7. The interatrial septum appears to be lipomatous.   PHYSICAL EXAM Constitutional: Appears well-developed and well-nourished.  Psych: Affect appropriate to situation Eyes: No scleral injection HENT: No OP obstrucion Head: Normocephalic.  Cardiovascular: Normal rate and regular  rhythm.  Respiratory: Effort normal, non-labored breathing Skin: WDI  Neuro: Mental Status: Patient is awake, alert, oriented to person, place, month, year, and situation. Patient is able to give a clear and coherent history. No signs of aphasia or neglect Poor recall - 0/3 words at 3 mins. Unable to follow 3-step command accurately. Cranial Nerves: II: Visual Fields are full. III,IV, VI: EOMI without ptosis or diploplia.  Pupils are equal, round, and reactive to light.   V: Facial sensation is symmetric to temperature VII: Facial movement is symmetric.  VIII: hearing is intact to voice X: Uvula elevates symmetrically XI: Shoulder shrug is symmetric. XII: tongue is midline without atrophy or fasciculations.  Motor: Tone is normal. Bulk is normal. 5/5 strength was present in all four extremities.  Sensory: Sensation is symmetric to light touch and temperature in the arms and legs. Cerebellar: FNF intact bilaterally   ASSESSMENT/PLAN Ms. RAGINA FENTER is a 85 y.o. female with history of PVC, NSVT, hypertension, bradycardia, PVCs s/p ablation (~10 year ago) and anxiety presenting with transient L facial weakness, dysarthria and L sided numbness x 15 mins.   R brain TIA  CT head no acute stroke. Chronic changes  MRI  No acute infarct. Small vessel disease. Old sm cerebellar infarcts. Old L occipital superficial siderosis.   MRA  No ELVO. Severe  R P3 stenosis vs flow artifact  CTA neck Unremarkable   2D Echo  EF 60-65%. No source of embolus   LDL 137  HgbA1c 5.7  Lovenox 40 mg sq daily for VTE prophylaxis  No antithrombotic prior to admission, now on aspirin 81 mg daily and clopidogrel 75 mg daily. Continue DAPT x 3 weeks then aspirin alone  Therapy recommendations:  No therapy needs  Disposition:  Return home [place] for d/c from stroke standpoint Follow-up Stroke Clinic at [facility] in 4 weeks. Office will call with appointment date and time. They  will assess and f/u with memory issues then. Order placed. No hx AF  per EPIC - tele neg here - recommend f/u w/ Dr. Rayann Heman - pt/family agreeable.   Hypertension  Stable . BP goal normotensive  Hyperlipidemia  Home meds:  No statin  Placed on lipitor 80 on admission  LDL 137, goal < 70  Decreased lipitor to 40  Continue statin at discharge  Other Stroke Risk Factors  Advanced age  Hx stroke/TIA per imaging only. Pt without known stroke  Other Active Problems  Anxiety on valium  Hx NSVT  Hospital day # 0  Crystal Sabin, Crystal Chang, Crystal Chang, Crystal Chang, Crystal Chang [facility] Stroke Nurse [place] for Schedule & Pager information 09/21/2018 3:35 PM   ATTENDING NOTE: I reviewed above note and agree with the assessment and plan. Pt was seen and examined.   85 year old female with history of PVC stat post ablation, hypertension, nonsustained V. tach, bradycardia admitted for episode of left facial droop and slurred speech lasting 15 minutes.  Now symptoms all resolved.  CT no acute finding.  MRI no acute infarct but left occipital small superficial siderosis.  MRA showed right P3 stenosis.  CTA neck unremarkable.  EF 60 to 65%.  LDL 137 and A1c 5.7.  Patient presentation could be TIA.  Given her extensive cardiac history, recommend 30-day CardioNet monitoring as outpatient to rule out A. fib.  Will recommend aspirin 81 and Plavix 75 DAPT for 3 weeks and then aspirin alone.  Put on Lipitor 40 for stroke prevention.  Okay to be discharged from neuro standpoint.  Patient seem to have memory difficulties.  She cannot remember she had 2D echo this morning.  Family and speech therapy was also concerning for her cognitive impairment and early dementia.  Recommend follow-up with neurology for neuropsych testing.  Neurology will sign off. Please call with questions. Pt will follow up with stroke clinic NP at [facility] in about 4 weeks. Thanks for the consult.   Crystal Hawking, MD PhD Stroke Neurology 09/21/2018 5:32 PM     To contact Stroke Continuity provider, please refer to [email or web address] After hours, contact General Neurology

## 2018-09-21 NOTE — Discharge Summary (Signed)
Physician Discharge Summary  Crystal Chang ZOX:[id number] DOB: 05-Aug-1930 DOA: 09/20/2018  PCP: Lajean Manes, MD  Admit date: 09/20/2018 Discharge date: 09/21/2018  Admitted From: Home Disposition:  Home  Recommendations for Outpatient Follow-up:  1. Follow up with PCP in 1 week 2. Follow up with Stroke Clinic at [facility] in 4 weeks 3. Recommend 30-day cardiac event monitoring. Follow up with Dr. Rayann Heman.  4. Continue aspirin/plavix for 3 weeks then aspirin alone.  5. Outpatient PT referral placed   Discharge Condition: Stable CODE STATUS: Full Diet recommendation: Heart healthy   Brief/Interim Summary: From H&P by Dr. Laren Everts: Crystal Chang  is a 85 y.o. female, with no significant past medical history except for anxiety and degenerative joint disease presenting with an acute episode left facial droop, dysarthria, and left arm and leg numbness .  The episode lasted for around 15 minutes and resolved completely with persistent mild numbness on the left side.  No preceding chest pains, palpitations nausea or vomiting.  No similar episodes in the past.  CT of the head was negative, done in the emergency room. She underwent further stroke work up including MRI which was negative for acute infarct, MRA showed right P3 stenosis. CTA neck unremarkable. EF 60-65%. LDL elevated.  Patient's main diagnosis thought to be TIA.  She was recommended to take aspirin/Plavix for 3 weeks then aspirin alone.  Also discharged on Lipitor for stroke prevention.  Cardiology contacted for setting of 30-day cardiac event monitoring to rule out A. fib as outpatient.  She needs to follow-up with neurology in 4 weeks.  Discharge Diagnoses:  Principal Problem:   TIA (transient ischemic attack) Active Problems:   Anxiety   GERD (gastroesophageal reflux disease)  Discharge Instructions  Discharge Instructions    Ambulatory referral to Neurology   Complete by:  As directed    Follow up with stroke  clinic NP (Jessica Vanschaick or Cecille Rubin, if both not available, consider Dr. Antony Contras, Dr. Bess Harvest, or Dr. Sarina Ill) at [facility] in about 4 weeks.  She is also having trouble with her memory - and needs OP eval and possible treatment.   Ambulatory referral to Physical Therapy   Complete by:  As directed    Call MD for:  difficulty breathing, headache or visual disturbances   Complete by:  As directed    Call MD for:  extreme fatigue   Complete by:  As directed    Call MD for:  hives   Complete by:  As directed    Call MD for:  persistant dizziness or light-headedness   Complete by:  As directed    Call MD for:  persistant nausea and vomiting   Complete by:  As directed    Call MD for:  severe uncontrolled pain   Complete by:  As directed    Call MD for:  temperature >100.4   Complete by:  As directed    Diet - low sodium heart healthy   Complete by:  As directed    Discharge instructions   Complete by:  As directed    TAKE ASPIRIN AND PLAVIX ONCE DAILY FOR 3 WEEKS. THEN STOP PLAVIX AND TAKE ASPIRIN DAILY.  You were cared for by a hospitalist during your hospital stay. If you have any questions about your discharge medications or the care you received while you were in the hospital after you are discharged, you can call the unit and ask to speak with the hospitalist on  call if the hospitalist that took care of you is not available. Once you are discharged, your primary care physician will handle any further medical issues. Please note that NO REFILLS for any discharge medications will be authorized once you are discharged, as it is imperative that you return to your primary care physician (or establish a relationship with a primary care physician if you do not have one) for your aftercare needs so that they can reassess your need for medications and monitor your lab values.   Increase activity slowly   Complete by:  As directed      Allergies  as of 09/21/2018      Reactions   Sulfonamide Derivatives       Medication List    TAKE these medications   aspirin 81 MG EC tablet Take 1 tablet (81 mg total) by mouth daily. Start taking on:  September 22, 2018   atorvastatin 40 MG tablet Commonly known as:  LIPITOR Take 1 tablet (40 mg total) by mouth daily at 6 PM.   calcium carbonate 200 MG capsule Take 250 mg by mouth 2 (two) times daily with a meal.   clopidogrel 75 MG tablet Commonly known as:  PLAVIX Take 1 tablet (75 mg total) by mouth daily. Start taking on:  September 22, 2018   diazepam 5 MG tablet Commonly known as:  VALIUM Take 5 mg by mouth every 6 (six) hours as needed.   docusate sodium 100 MG capsule Commonly known as:  COLACE Take 100 mg by mouth daily as needed for mild constipation.   MULTI-VITAMIN PO Take by mouth.   pantoprazole 40 MG tablet Commonly known as:  PROTONIX Take 40 mg by mouth daily.   trolamine salicylate 10 % cream Commonly known as:  ASPERCREME Apply 1 application topically as needed (joint pain).   vitamin B-12 1000 MCG tablet Commonly known as:  CYANOCOBALAMIN Take 1,000 mcg by mouth daily.      Follow-up Information    [facility] Follow up in 4 week(s).   Specialty:  Neurology Why:  stroke clinic. office will call wtih appt date and time. will also check your memory Contact information: [address] [place] [phone]       Lajean Manes, MD. Schedule an appointment as soon as possible for a visit in 1 week(s).   Specialty:  Internal Medicine Contact information: [address] [place] [address] [phone]        Thompson Grayer, MD Follow up.   Specialty:  Cardiology Contact information: [address] [place] [address] [phone]          Allergies  Allergen Reactions  . Sulfonamide Derivatives      Consultations:  Neurology   Procedures/Studies: Ct Head Wo Contrast  Result Date: 09/20/2018 CLINICAL DATA:  Facial droop EXAM: CT HEAD WITHOUT CONTRAST TECHNIQUE: Contiguous axial images were obtained from the base of the skull through the vertex without intravenous contrast. COMPARISON:  07/28/2009 FINDINGS: Brain: Mild atrophic changes and chronic white matter ischemic changes are seen. No findings to suggest acute hemorrhage, acute infarction or space-occupying mass lesion are noted. Vascular: No hyperdense vessel or unexpected calcification. Skull: Normal. Negative for fracture or focal lesion. Sinuses/Orbits: No acute finding. Other: None. IMPRESSION: Chronic atrophic and ischemic changes without acute abnormality. Electronically Signed   By: Inez Catalina M.D.   On: 09/20/2018 14:39   Ct Angio Neck W Or Wo Contrast  Result Date: 09/20/2018 CLINICAL DATA:  85 y/o F; acute transit episode of left facial droop, dysarthria, and left extremity numbness. Mild residual left-sided numbness. EXAM: CT ANGIOGRAPHY NECK TECHNIQUE: Multidetector CT imaging of the neck was performed using the standard protocol during bolus administration of intravenous contrast. Multiplanar CT image reconstructions and MIPs were obtained to evaluate the vascular anatomy. Carotid stenosis measurements (when applicable) are obtained utilizing NASCET criteria, using the distal internal carotid diameter as the denominator. CONTRAST:  91mL ISOVUE-370 IOPAMIDOL (ISOVUE-370) INJECTION 76% COMPARISON:  09/20/2018 MRI and MRA of the head. 09/20/2018 CT of the head. FINDINGS: Aortic arch: Standard branching. Imaged portion shows no evidence of aneurysm or dissection. No significant stenosis of the major arch vessel origins. Right carotid system: No evidence of dissection, stenosis (50% or greater) or occlusion. Left carotid system: No evidence of dissection, stenosis (50% or greater) or occlusion. Vertebral arteries: Left dominant.  No evidence of dissection, stenosis (50% or greater) or occlusion. Skeleton: C3-4 grade 1 anterolisthesis. Moderate spondylosis of the cervical spine with multilevel disc and facet degenerative changes. No high-grade bony spinal canal stenosis. Other neck: Negative. Upper chest: Negative. IMPRESSION: Patent carotid and vertebral arteries. No dissection, aneurysm, or hemodynamically significant stenosis utilizing NASCET criteria. Electronically Signed   By: Kristine Garbe M.D.   On: 09/20/2018 22:36   Mr Brain Wo Contrast  Result Date: 09/20/2018 CLINICAL DATA:  15 minutes episode LEFT facial droop, dysarthria and LEFT extremity numbness. History of hypertension. EXAM: MRI HEAD WITHOUT CONTRAST MRA HEAD WITHOUT CONTRAST TECHNIQUE: Multiplanar, multiecho pulse sequences of the brain and surrounding structures were obtained without intravenous contrast. Angiographic images of the head were obtained using MRA technique without contrast. COMPARISON:  CT HEAD September 20, 2018 and CT HEAD June 17, 2015 FINDINGS: MRI HEAD FINDINGS INTRACRANIAL CONTENTS: No reduced diffusion to suggest acute ischemia. LEFT occipital chronic microhemorrhage and extra-axial hemosiderin staining. No parenchymal brain volume loss for age. Patchy to confluent supratentorial white matter FLAIR T2 hyperintensities. Old small cerebellar infarcts. No hydrocephalus. No suspicious parenchymal signal, masses, mass effect. No abnormal extra-axial fluid collections. No extra-axial masses. VASCULAR: Normal major intracranial vascular flow voids present at skull base. SKULL AND UPPER CERVICAL SPINE: No abnormal sellar expansion. No suspicious calvarial bone marrow signal. Craniocervical junction maintained. SINUSES/ORBITS: The mastoid air-cells and included paranasal sinuses are well-aerated.The included ocular globes and orbital contents are non-suspicious. Status post bilateral ocular lens implants. OTHER: None. MRA HEAD  FINDINGS-moderately motion degraded examination resulting in duplicated appearance of mid to distal vessels. ANTERIOR CIRCULATION: Normal flow related enhancement of the included cervical, petrous, cavernous and supraclinoid internal carotid arteries. Patent anterior communicating artery. Patent anterior and middle cerebral arteries. No large vessel occlusion, flow limiting stenosis, aneurysm. POSTERIOR CIRCULATION: LEFT vertebral artery is dominant. Vertebrobasilar arteries are patent, with normal flow related enhancement of the main branch vessels. Patent posterior cerebral arteries. Severe RIGHT P3 stenosis versus flow artifact. Robust LEFT posterior communicating artery present. No large vessel occlusion, aneurysm. ANATOMIC VARIANTS: None. Source images and MIP images were reviewed. IMPRESSION: MRI HEAD: 1. No acute intracranial process. 2. Moderate chronic small vessel ischemic changes. Old small cerebellar infarcts. 3. Stable LEFT occipital superficial siderosis. MRA HEAD: 1. Motion degraded examination. No emergent large vessel occlusion. 2. Severe RIGHT P3 stenosis versus flow artifact. Electronically Signed   By: Elon Alas M.D.   On: 09/20/2018 20:57   Mr Jodene Nam Head Wo Contrast  Result Date: 09/20/2018 CLINICAL DATA:  15 minutes episode LEFT facial droop, dysarthria and  LEFT extremity numbness. History of hypertension. EXAM: MRI HEAD WITHOUT CONTRAST MRA HEAD WITHOUT CONTRAST TECHNIQUE: Multiplanar, multiecho pulse sequences of the brain and surrounding structures were obtained without intravenous contrast. Angiographic images of the head were obtained using MRA technique without contrast. COMPARISON:  CT HEAD September 20, 2018 and CT HEAD June 17, 2015 FINDINGS: MRI HEAD FINDINGS INTRACRANIAL CONTENTS: No reduced diffusion to suggest acute ischemia. LEFT occipital chronic microhemorrhage and extra-axial hemosiderin staining. No parenchymal brain volume loss for age. Patchy to confluent  supratentorial white matter FLAIR T2 hyperintensities. Old small cerebellar infarcts. No hydrocephalus. No suspicious parenchymal signal, masses, mass effect. No abnormal extra-axial fluid collections. No extra-axial masses. VASCULAR: Normal major intracranial vascular flow voids present at skull base. SKULL AND UPPER CERVICAL SPINE: No abnormal sellar expansion. No suspicious calvarial bone marrow signal. Craniocervical junction maintained. SINUSES/ORBITS: The mastoid air-cells and included paranasal sinuses are well-aerated.The included ocular globes and orbital contents are non-suspicious. Status post bilateral ocular lens implants. OTHER: None. MRA HEAD FINDINGS-moderately motion degraded examination resulting in duplicated appearance of mid to distal vessels. ANTERIOR CIRCULATION: Normal flow related enhancement of the included cervical, petrous, cavernous and supraclinoid internal carotid arteries. Patent anterior communicating artery. Patent anterior and middle cerebral arteries. No large vessel occlusion, flow limiting stenosis, aneurysm. POSTERIOR CIRCULATION: LEFT vertebral artery is dominant. Vertebrobasilar arteries are patent, with normal flow related enhancement of the main branch vessels. Patent posterior cerebral arteries. Severe RIGHT P3 stenosis versus flow artifact. Robust LEFT posterior communicating artery present. No large vessel occlusion, aneurysm. ANATOMIC VARIANTS: None. Source images and MIP images were reviewed. IMPRESSION: MRI HEAD: 1. No acute intracranial process. 2. Moderate chronic small vessel ischemic changes. Old small cerebellar infarcts. 3. Stable LEFT occipital superficial siderosis. MRA HEAD: 1. Motion degraded examination. No emergent large vessel occlusion. 2. Severe RIGHT P3 stenosis versus flow artifact. Electronically Signed   By: Elon Alas M.D.   On: 09/20/2018 20:57    Echo IMPRESSIONS   1. The left ventricle has 60-65%. The cavity size is normal. There  is normal left ventricular hypertrophy. Echo evidence of impaired relaxation diastolic filling patterns. Normal left ventricular filling pressures.  2. Normal left atrial size.  3. Normal right atrial size.  4. Normal tricuspid valve.  5. The aortic valve normal. There is mild thickening and mild calcification of the aortic valve.  6. No atrial level shunt detected by color flow Doppler.  7. The interatrial septum appears to be lipomatous.     Discharge Exam: Vitals:   09/21/18 0740 09/21/18 1202  BP: 113/75 115/64  Pulse: 68 66  Resp: 16 17  Temp: 98.3 F (36.8 C) 98.2 F (36.8 C)  SpO2: 98% 97%    General: Pt is alert, awake, not in acute distress Cardiovascular: RRR, S1/S2 +, no rubs, no gallops Respiratory: CTA bilaterally, no wheezing, no rhonchi Abdominal: Soft, NT, ND, bowel sounds + Extremities: no edema, no cyanosis    The results of significant diagnostics from this hospitalization (including imaging, microbiology, ancillary and laboratory) are listed below for reference.     Microbiology: No results found for this or any previous visit (from the past 240 hour(s)).   Labs: BNP (last 3 results) No results for input(s): BNP in the last 8760 hours. Basic Metabolic Panel: Recent Labs  Lab 09/20/18 1120  NA 135  K 4.2  CL 103  CO2 25  GLUCOSE 109*  BUN 9  CREATININE 0.74  CALCIUM 10.0   Liver Function Tests: Recent Labs  Lab 09/20/18 1120  AST 21  ALT 12  ALKPHOS 76  BILITOT 0.8  PROT 6.5  ALBUMIN 3.6   No results for input(s): LIPASE, AMYLASE in the last 168 hours. No results for input(s): AMMONIA in the last 168 hours. CBC: Recent Labs  Lab 09/20/18 1120  WBC 7.5  NEUTROABS 4.3  HGB 13.9  HCT 41.5  MCV 93.7  PLT 270   Cardiac Enzymes: No results for input(s): CKTOTAL, CKMB, CKMBINDEX, TROPONINI in the last 168 hours. BNP: Invalid input(s): POCBNP CBG: No results for input(s): GLUCAP in the last 168 hours. D-Dimer No results for  input(s): DDIMER in the last 72 hours. Hgb A1c Recent Labs    09/21/18 0453  HGBA1C 5.7*   Lipid Profile Recent Labs    09/21/18 0453  CHOL 212*  HDL 56  LDLCALC 137*  TRIG 93  CHOLHDL 3.8   Thyroid function studies No results for input(s): TSH, T4TOTAL, T3FREE, THYROIDAB in the last 72 hours.  Invalid input(s): FREET3 Anemia work up No results for input(s): VITAMINB12, FOLATE, FERRITIN, TIBC, IRON, RETICCTPCT in the last 72 hours. Urinalysis    Component Value Date/Time   COLORURINE YELLOW 09/20/2018 1101   APPEARANCEUR CLEAR 09/20/2018 1101   LABSPEC 1.006 09/20/2018 1101   PHURINE 7.0 09/20/2018 1101   GLUCOSEU NEGATIVE 09/20/2018 1101   HGBUR NEGATIVE 09/20/2018 [place] 09/20/2018 1101   KETONESUR NEGATIVE 09/20/2018 1101   PROTEINUR NEGATIVE 09/20/2018 1101   UROBILINOGEN 0.2 07/29/2009 0028   NITRITE NEGATIVE 09/20/2018 [place] 09/20/2018 1101   Sepsis Labs Invalid input(s): PROCALCITONIN,  WBC,  LACTICIDVEN Microbiology No results found for this or any previous visit (from the past 240 hour(s)).   Patient was seen and examined on the day of discharge and was found to be in stable condition. Time coordinating discharge: 35 minutes including assessment and coordination of care, as well as examination of the patient.   SIGNED:  Dessa Phi, DO [facility] [email or web address] 09/21/2018, 4:08 PM

## 2018-09-21 NOTE — Progress Notes (Signed)
*  PRELIMINARY RESULTS* Echocardiogram 2D Echocardiogram has been performed.  Leavy Cella 09/21/2018, 9:35 AM

## 2018-09-21 NOTE — Plan of Care (Signed)
Adequate for discharge.

## 2018-09-21 NOTE — Progress Notes (Signed)
OT Cancellation Note  Patient Details Name: WILMER BERRYHILL MRN: [id number] DOB: 1930-01-23   Cancelled Treatment:    Reason Eval/Treat Not Completed: Patient at procedure or test/ unavailable(with Speech Therapist). Plan to reattempt later this morning.  Tyrone Schimke, OT Acute Rehabilitation Services Pager: [phone] Office: [phone]  09/21/2018, 9:02 AM

## 2018-09-21 NOTE — Progress Notes (Signed)
Occupational Therapy Evaluation Patient Details Name: Crystal Chang MRN: [id number] DOB: 01-Jun-1930 Today's Date: 09/21/2018    History of Present Illness Pt is an 85 y/o female admitted secondary to L UE numbness/tingling. MRI was negative for any acute findings. PMH including but not limited to HTN and bradycardia.   Clinical Impression   Pt admitted with the above diagnoses and presents with below problem list. Pt will benefit from continued acute OT to address the below listed deficits and maximize independence with basic ADLs prior to d/c home. PTA pt was independent with ADLs. Pt is currently supervision with LB ADLs and functional mobility. Spouse, daughter, and granddaughter present during eval.      Follow Up Recommendations  No OT follow up    Equipment Recommendations  None recommended by OT    Recommendations for Other Services       Precautions / Restrictions Precautions Precautions: None Restrictions Weight Bearing Restrictions: No      Mobility Bed Mobility Overal bed mobility: Modified Independent                Transfers Overall transfer level: Needs assistance Equipment used: None Transfers: Sit to/from Stand Sit to Stand: Supervision         General transfer comment: supervision for safety    Balance Overall balance assessment: Needs assistance Sitting-balance support: Feet supported Sitting balance-Leahy Scale: Good     Standing balance support: No upper extremity supported Standing balance-Leahy Scale: Fair                   Standardized Balance Assessment Standardized Balance Assessment : Dynamic Gait Index   Dynamic Gait Index Level Surface: Normal Change in Gait Speed: Mild Impairment Gait with Horizontal Head Turns: Mild Impairment Gait with Vertical Head Turns: Normal Gait and Pivot Turn: Mild Impairment Step Over Obstacle: Mild Impairment Step Around Obstacles: Normal Steps: Moderate Impairment Total Score:  18     ADL either performed or assessed with clinical judgement   ADL Overall ADL's : Needs assistance/impaired Eating/Feeding: Set up;Sitting   Grooming: Supervision/safety;Standing   Upper Body Bathing: Set up;Sitting   Lower Body Bathing: Supervison/ safety;Sit to/from stand   Upper Body Dressing : Set up;Sitting   Lower Body Dressing: Supervision/safety;Sit to/from stand   Toilet Transfer: Supervision/safety;Ambulation   Toileting- Clothing Manipulation and Hygiene: Supervision/safety   Tub/ Shower Transfer: Supervision/safety;Ambulation   Functional mobility during ADLs: Supervision/safety General ADL Comments: Pt completed toilet transfer, grooming task standing at sink, and household distance functional mobility.     Vision Patient Visual Report: No change from baseline       Perception     Praxis      Pertinent Vitals/Pain Pain Assessment: No/denies pain     Hand Dominance Right   Extremity/Trunk Assessment Upper Extremity Assessment Upper Extremity Assessment: Overall WFL for tasks assessed;Generalized weakness   Lower Extremity Assessment Lower Extremity Assessment: Defer to PT evaluation       Communication Communication Communication: No difficulties   Cognition Arousal/Alertness: Awake/alert Behavior During Therapy: WFL for tasks assessed/performed Overall Cognitive Status: Impaired/Different from baseline Area of Impairment: Memory                     Memory: Decreased short-term memory             General Comments  Spouse, daughter, and grandaughter present.    Exercises     Shoulder Instructions      Home Living Family/patient expects to be  discharged to:: Private residence Living Arrangements: Spouse/significant other Available Help at Discharge: Family;Available 24 hours/day Type of Home: House Home Access: Stairs to enter [employer] of Steps: 1 Entrance Stairs-Rails: None Home Layout: One level      Bathroom Shower/Tub: Occupational psychologist: Standard     Home Equipment: Shower seat - built in;Walker - 2 wheels;Cane - single point;Crutches      Lives With: Spouse    Prior Functioning/Environment Level of Independence: Independent                 OT Problem List: Impaired balance (sitting and/or standing);Decreased knowledge of use of DME or AE;Decreased knowledge of precautions      OT Treatment/Interventions: Self-care/ADL training;DME and/or AE instruction;Therapeutic activities;Patient/family education;Balance training    OT Goals(Current goals can be found in the care plan section) Acute Rehab OT Goals Patient Stated Goal: "go home today" ADL Goals Pt Will Perform Grooming: Independently;standing Pt Will Perform Tub/Shower Transfer: Independently;Shower transfer;ambulating  OT Frequency: Min 2X/week   Barriers to D/C:            Co-evaluation              AM-PAC OT "6 Clicks" Daily Activity     Outcome Measure Help from another person eating meals?: None Help from another person taking care of personal grooming?: None Help from another person toileting, which includes using toliet, bedpan, or urinal?: None Help from another person bathing (including washing, rinsing, drying)?: None Help from another person to put on and taking off regular upper body clothing?: None Help from another person to put on and taking off regular lower body clothing?: None 6 Click Score: 24   End of Session    Activity Tolerance: Patient tolerated treatment well Patient left: in bed;with call bell/phone within reach;with family/visitor present  OT Visit Diagnosis: Unsteadiness on feet (R26.81)                Time: 4098-1191 OT Time Calculation (min): 17 min Charges:  OT General Charges $OT Visit: 1 Visit OT Evaluation $OT Eval Low Complexity: [place], OT Acute Rehabilitation Services Pager: [phone] Office:  [phone]   Hortencia Pilar 09/21/2018, 11:09 AM

## 2018-09-21 NOTE — Evaluation (Signed)
Speech Language Pathology Evaluation Patient Details Name: Crystal Chang MRN: [id number] DOB: [birth year] Today's Date: 09/21/2018 Time: 2330-0762 SLP Time Calculation (min) (ACUTE ONLY): 28 min  Problem List:  Patient Active Problem List   Diagnosis Date Noted  . TIA (transient ischemic attack) 09/20/2018  . BRADYCARDIA 07/09/2009  . VENTRICULAR TACHYCARDIA, PAROXYSMAL 06/27/2009  . HYPERTENSION 06/26/2009   Past Medical History:  Past Medical History:  Diagnosis Date  . Anxiety   . Bradycardia    due to PVC resoloved after PVC ablation  . Cystitis   . DJD (degenerative joint disease)   . GERD (gastroesophageal reflux disease)   . Hypertension   . NSVT (nonsustained ventricular tachycardia) ([place])   . PVC (premature ventricular contraction)    Past Surgical History:  Past Surgical History:  Procedure Laterality Date  . KNEE SURGERY    . TOTAL ABDOMINAL HYSTERECTOMY     HPI:  Pt is a 85 y.o. female, with no significant past medical history except for anxiety and degenerative joint disease who presented to the ED with an acute episode left facial droop, dysarthria, and left arm and leg numbness. MRI of 1/29 was negative.    Assessment / Plan / Recommendation Clinical Impression  Pt was seen for speech/language/cognition evaluation. She was alert and cooperative throughout the evaluation and reported that her motor speech deficits have resolved. Per the pt, she has been noticing increased difficulty with memory during the past three months and she has concerns about the possibility of her getting Alzheimer's disease. Based on today's assessment, her speech and language are within normal limits and her cognition appears within functional limits. She did demonstrate some difficulty during delayed recall tasks but responded well to minimal cues. Further skilled SLP services are not clinically indicated at this time. However, she was encouraged to speak with the neurologist and her PCP  regarding her concerns about Alzheimer's disease. Pt verbalized understanding and agreement with recommendations and the plan of care.     SLP Assessment  SLP Recommendation/Assessment: Patient does not need any further Speech Lanaguage Pathology Services SLP Visit Diagnosis: Cognitive communication deficit (R41.841)    Follow Up Recommendations  None    Frequency and Duration           SLP Evaluation Cognition  Overall Cognitive Status: Within Functional Limits for tasks assessed Arousal/Alertness: Awake/alert Orientation Level: Oriented to person;Oriented to place;Oriented to situation(Day: Pt provided "Friday" but all other aspects were accurat) Attention: Focused;Sustained Focused Attention: Appears intact Sustained Attention: Appears intact Memory: Impaired Memory Impairment: Retrieval deficit(Delayed recall: 2/3; 3/3 with cues ) Awareness: Appears intact Problem Solving: Appears intact Executive Function: Reasoning       Comprehension  Auditory Comprehension Overall Auditory Comprehension: Appears within functional limits for tasks assessed Yes/No Questions: Within Functional Limits Commands: Within Functional Limits(2-step: 4/4; 3-step: 3/4) Conversation: Complex Reading Comprehension Reading Status: Within funtional limits    Expression Expression Primary Mode of Expression: Verbal Verbal Expression Overall Verbal Expression: Appears within functional limits for tasks assessed Initiation: No impairment Automatic Speech: Counting;Day of week;Month of year Level of Generative/Spontaneous Verbalization: Sentence Repetition: No impairment Naming: No impairment(Slightly increased processing time ) Pragmatics: No impairment Written Expression Dominant Hand: Right   Oral / Motor  Oral Motor/Sensory Function Overall Oral Motor/Sensory Function: Within functional limits Motor Speech Overall Motor Speech: Appears within functional limits for tasks  assessed Respiration: Within functional limits Phonation: Normal Resonance: Within functional limits Articulation: Within functional limitis Intelligibility: Intelligible Motor Planning: Witnin functional limits Motor  Speech Errors: Not applicable   Ginni Eichler I. Hardin Negus, [place], [place] Office number [phone] Pager [place] 09/21/2018, 9:27 AM

## 2018-09-21 NOTE — Evaluation (Signed)
Physical Therapy Evaluation Patient Details Name: Crystal Chang MRN: [id number] DOB: 12/26/1929 Today's Date: 09/21/2018   History of Present Illness  Pt is an 85 y/o female admitted secondary to L UE numbness/tingling. MRI was negative for any acute findings. PMH including but not limited to HTN and bradycardia.    Clinical Impression  Pt presented supine in bed with HOB elevated, awake and willing to participate in therapy session. Prior to admission, pt reported that she was independent with all functional mobility and ADLs. Pt currently at mod I for bed mobility, supervision for transfers and min guard to ambulate in hallway without use of an AD. Pt also participated in a higher level balance assessment and scored an 18/24 on the DGI, indicating that she is at an increased risk for falls. Therefore, recommending OP PT to address her higher level balance deficits. Pt would continue to benefit from skilled physical therapy services at this time while admitted and after d/c to address the below listed limitations in order to improve overall safety and independence with functional mobility.     Follow Up Recommendations Outpatient PT    Equipment Recommendations  None recommended by PT    Recommendations for Other Services       Precautions / Restrictions Precautions Precautions: None Restrictions Weight Bearing Restrictions: No      Mobility  Bed Mobility Overal bed mobility: Modified Independent                Transfers Overall transfer level: Needs assistance Equipment used: None Transfers: Sit to/from Stand Sit to Stand: Supervision         General transfer comment: supervision for safety  Ambulation/Gait Ambulation/Gait assistance: Min guard Gait Distance (Feet): 200 Feet Assistive device: None Gait Pattern/deviations: Step-through pattern Gait velocity: able to fluctuate   General Gait Details: pt with mild instability but no overt LOB or need for  physical assistance, min guard for safety  Stairs Stairs: Yes Stairs assistance: Min guard Stair Management: Two rails;Step to pattern;Forwards Number of Stairs: 2    Wheelchair Mobility    Modified Rankin (Stroke Patients Only) Modified Rankin (Stroke Patients Only) Pre-Morbid Rankin Score: No symptoms Modified Rankin: No symptoms     Balance Overall balance assessment: Needs assistance Sitting-balance support: Feet supported Sitting balance-Leahy Scale: Good     Standing balance support: No upper extremity supported Standing balance-Leahy Scale: Fair                   Standardized Balance Assessment Standardized Balance Assessment : Dynamic Gait Index   Dynamic Gait Index Level Surface: Normal Change in Gait Speed: Mild Impairment Gait with Horizontal Head Turns: Mild Impairment Gait with Vertical Head Turns: Normal Gait and Pivot Turn: Mild Impairment Step Over Obstacle: Mild Impairment Step Around Obstacles: Normal Steps: Moderate Impairment Total Score: 18       Pertinent Vitals/Pain Pain Assessment: No/denies pain    Home Living Family/patient expects to be discharged to:: Private residence Living Arrangements: Spouse/significant other Available Help at Discharge: Family;Available 24 hours/day Type of Home: House Home Access: Stairs to enter Entrance Stairs-Rails: None Entrance Stairs-Number of Steps: 1 Home Layout: One level Home Equipment: Shower seat - built in;Walker - 2 wheels;Cane - single point;Crutches      Prior Function Level of Independence: Independent               Hand Dominance   Dominant Hand: Right    Extremity/Trunk Assessment   Upper Extremity Assessment Upper Extremity  Assessment: Overall WFL for tasks assessed;Defer to OT evaluation    Lower Extremity Assessment Lower Extremity Assessment: Overall WFL for tasks assessed       Communication   Communication: No difficulties  Cognition  Arousal/Alertness: Awake/alert Behavior During Therapy: WFL for tasks assessed/performed Overall Cognitive Status: Impaired/Different from baseline Area of Impairment: Memory                     Memory: Decreased short-term memory                General Comments      Exercises     Assessment/Plan    PT Assessment Patient needs continued PT services  PT Problem List Decreased balance;Decreased mobility;Decreased coordination       PT Treatment Interventions DME instruction;Gait training;Stair training;Functional mobility training;Therapeutic activities;Therapeutic exercise;Balance training;Neuromuscular re-education;Patient/family education    PT Goals (Current goals can be found in the Care Plan section)  Acute Rehab PT Goals Patient Stated Goal: "go home today" PT Goal Formulation: With patient Time For Goal Achievement: 10/05/18 Potential to Achieve Goals: Good    Frequency Min 3X/week   Barriers to discharge        Co-evaluation               AM-PAC PT "6 Clicks" Mobility  Outcome Measure Help needed turning from your back to your side while in a flat bed without using bedrails?: None Help needed moving from lying on your back to sitting on the side of a flat bed without using bedrails?: None Help needed moving to and from a bed to a chair (including a wheelchair)?: None Help needed standing up from a chair using your arms (e.g., wheelchair or bedside chair)?: None Help needed to walk in hospital room?: A Little Help needed climbing 3-5 steps with a railing? : A Little 6 Click Score: 22    End of Session Equipment Utilized During Treatment: Gait belt Activity Tolerance: Patient tolerated treatment well Patient left: in bed;with call bell/phone within reach;with family/visitor present Nurse Communication: Mobility status PT Visit Diagnosis: Other abnormalities of gait and mobility (R26.89);Other symptoms and signs involving the nervous system  (R29.898)    Time: 1552-0802 PT Time Calculation (min) (ACUTE ONLY): 29 min   Charges:   PT Evaluation $PT Eval Moderate Complexity: 1 Mod PT Treatments $Gait Training: 8-22 mins        Sherie Don, [place], DPT  Acute Rehabilitation Services Pager [phone] Office [place] 09/21/2018, 10:44 AM

## 2018-09-21 NOTE — Care Management Obs Status (Signed)
[facility] NOTIFICATION   Patient Details  Name: Crystal Chang MRN: [id number] Date of Birth: [birth year]   Medicare Observation Status Notification Given:  Yes    Pollie Friar, RN 09/21/2018, 1:46 PM

## 2018-09-21 NOTE — Care Management Note (Signed)
Case Management Note  Patient Details  Name: Crystal Chang MRN: [id number] Date of Birth: [birth year]  Subjective/Objective:    Pt in with a TIA. She is from home with spouse. DME: none No issues with medications.  Denies issues with transportation.                Action/Plan: CM consulted for Outpatient therapy. CM met with the patient and her family and they prefer [facility]. Orders faxed to [facility].  Family able to provide supervision at home and transportation to home.   Expected Discharge Date:  09/21/18               Expected Discharge Plan:  OP Rehab  In-House Referral:     Discharge planning Services  CM Consult  Post Acute Care Choice:    Choice offered to:     DME Arranged:    DME Agency:     HH Arranged:    HH Agency:     Status of Service:  Completed, signed off  If discussed at [employer] of Stay Meetings, dates discussed:    Additional Comments:  Pollie Friar, RN 09/21/2018, 4:09 PM

## 2018-09-22 DIAGNOSIS — F419 Anxiety disorder, unspecified: Secondary | ICD-10-CM

## 2018-09-22 DIAGNOSIS — K219 Gastro-esophageal reflux disease without esophagitis: Secondary | ICD-10-CM

## 2018-09-29 DIAGNOSIS — I10 Essential (primary) hypertension: Secondary | ICD-10-CM | POA: Diagnosis not present

## 2018-09-29 DIAGNOSIS — E7800 Pure hypercholesterolemia, unspecified: Secondary | ICD-10-CM | POA: Diagnosis not present

## 2018-10-11 ENCOUNTER — Ambulatory Visit (INDEPENDENT_AMBULATORY_CARE_PROVIDER_SITE_OTHER): Payer: PPO

## 2018-10-11 DIAGNOSIS — I472 Ventricular tachycardia: Secondary | ICD-10-CM | POA: Diagnosis not present

## 2018-10-11 DIAGNOSIS — I639 Cerebral infarction, unspecified: Secondary | ICD-10-CM | POA: Diagnosis not present

## 2018-10-11 DIAGNOSIS — I4729 Other ventricular tachycardia: Secondary | ICD-10-CM

## 2018-11-01 ENCOUNTER — Encounter: Payer: Self-pay | Admitting: Neurology

## 2018-11-01 ENCOUNTER — Other Ambulatory Visit: Payer: Self-pay

## 2018-11-01 ENCOUNTER — Ambulatory Visit: Payer: PPO | Admitting: Neurology

## 2018-11-01 ENCOUNTER — Encounter (INDEPENDENT_AMBULATORY_CARE_PROVIDER_SITE_OTHER): Payer: Self-pay

## 2018-11-01 VITALS — BP 138/72 | HR 64 | Ht 61.0 in | Wt 153.0 lb

## 2018-11-01 DIAGNOSIS — F03918 Unspecified dementia, unspecified severity, with other behavioral disturbance: Secondary | ICD-10-CM

## 2018-11-01 DIAGNOSIS — G459 Transient cerebral ischemic attack, unspecified: Secondary | ICD-10-CM | POA: Diagnosis not present

## 2018-11-01 DIAGNOSIS — F0391 Unspecified dementia with behavioral disturbance: Secondary | ICD-10-CM | POA: Diagnosis not present

## 2018-11-01 DIAGNOSIS — R413 Other amnesia: Secondary | ICD-10-CM | POA: Diagnosis not present

## 2018-11-01 MED ORDER — DONEPEZIL HCL 10 MG PO TABS: 10.0000 mg | ORAL_TABLET | Freq: Every day | ORAL | 3 refills | Status: DC

## 2018-11-01 MED ORDER — ATORVASTATIN CALCIUM 40 MG PO TABS: 40.0000 mg | ORAL_TABLET | Freq: Every day | ORAL | 3 refills | Status: DC

## 2018-11-01 NOTE — Patient Instructions (Signed)
I had a long d/w patient, her husband and daughter about her recent TIA, memory loss and cognitive impairment, risk for recurrent stroke/TIAs, personally independently reviewed imaging studies and stroke evaluation results and answered questions.Continue aspirin 81 mg daily  for secondary stroke prevention and maintain strict control of hypertension with blood pressure goal below 130/90, diabetes with hemoglobin A1c goal below 6.5% and lipids with LDL cholesterol goal below 70 mg/dL. I also advised the patient to eat a healthy diet with plenty of whole grains, cereals, fruits and vegetables, exercise regularly and maintain ideal body weight.  Check memory panel labs, EEG.  I have encouraged her to increase participation in cognitively challenging activities like solving crossword puzzles, playing bridge and sudoku.  Trial of Aricept 5 mg daily for a month to be increased to 10 mg daily if tolerated without side effects.  We also discussed memory compensation strategies.  Followup in the future with me in 2 months or call earlier if necessary. Memory Compensation Strategies  1. Use "WARM" strategy.  W= write it down  A= associate it  R= repeat it  M= make a mental note  2.   You can keep a Social worker.  Use a 3-ring notebook with sections for the following: calendar, important names and phone numbers,  medications, doctors' names/phone numbers, lists/reminders, and a section to journal what you did  each day.   3.    Use a calendar to write appointments down.  4.    Write yourself a schedule for the day.  This can be placed on the calendar or in a separate section of the Memory Notebook.  Keeping a  regular schedule can help memory.  5.    Use medication organizer with sections for each day or morning/evening pills.  You may need help loading it  6.    Keep a basket, or pegboard by the door.  Place items that you need to take out with you in the basket or on the pegboard.  You may also want  to  include a message board for reminders.  7.    Use sticky notes.  Place sticky notes with reminders in a place where the task is performed.  For example: " turn off the  stove" placed by the stove, "lock the door" placed on the door at eye level, " take your medications" on  the bathroom mirror or by the place where you normally take your medications.  8.    Use alarms/timers.  Use while cooking to remind yourself to check on food or as a reminder to take your medicine, or as a  reminder to make a call, or as a reminder to perform another task, etc.

## 2018-11-01 NOTE — Progress Notes (Signed)
[facility] [address] [place]. [place] [address] [phone]       OFFICE CONSULT NOTE  Crystal Chang Date of Birth:  28-Feb-1930 Medical Record Number:  [id number]   Referring MD: Burnetta Sabin, NP Reason for Referral: TIA HPI: Ms. Costello is a 85 year old pleasant Caucasian lady seen today for initial office consultation visit.  She is accompanied by her husband and daughter.  History is obtained from them and review of electronic medical records.  I personally reviewed imaging films in PACS.  Ms. Cloer has past medical history of hypertension, atrial fibrillation status post ablation greater than 10 years ago, anxiety who presented on 09/20/2018 with sudden onset of left facial droop when she got up after working on a computer.  She called her husband who called the daughter by the time they reached the her home patient symptoms had resolved within less than 5 minutes.  When seen in the hospital she had NIH stroke scale of 0.  CT scan of the head was unremarkable MRI scan of the brain was also obtained which showed no acute abnormality.  MRI of the brain showed no large vessel stenosis except mild stenosis of distal posterior cerebral artery.  Transthoracic echo showed normal ejection fraction.  CT angiogram of the brain and neck were unremarkable.  LDL cholesterol was elevated at 137 mg percent and hemoglobin A1c was normal at 5.7.  Telemetry monitoring in the hospital showed no evidence of A. fib.  Patient is currently having external cardiac monitoring done for A. fib and so far it has not yet been found.  The patient is tolerating aspirin without bleeding or bruising.  She has had no further recurrent stroke or TIA symptoms.  The patient's family states that she has had mild memory and cognitive difficulties for several years but the last several months these appear to have progressed.  She gets intermittently confused as well as agitated.  On 1 day she took off her heart  monitor.  On another occasion she fell at [facility] and was unable to explain what happened.  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  Patient has never been evaluated for reversible causes of cognitive impairment or started on medication like Aricept and Namenda.  She does have a family history of Alzheimer's in her brother and worries about this diagnosis.  She still living by herself and is independent in activities of daily living.  She does not drive.  ROS:   14 system review of systems is positive for joint pain, joint swelling, back pain, memory loss, numbness, confusion and all other systems negative  PMH:  Past Medical History:  Diagnosis Date   Anxiety    Bradycardia    due to PVC resoloved after PVC ablation   Cystitis    DJD (degenerative joint disease)    GERD (gastroesophageal reflux disease)    Hypertension    NSVT (nonsustained ventricular tachycardia) ([facility])    PVC (premature ventricular contraction)     Social History:  Social History   Socioeconomic History   Marital status: Married    Spouse name: Not on file   Number of children: Not on file   Years of education: Not on file   Highest education level: Not on file  Occupational History   Not on file  Social Needs   Financial resource strain: Not on file   Food insecurity:  Worry: Not on file    Inability: Not on file   Transportation needs:    Medical: Not on file    Non-medical: Not on file  Tobacco Use   Smoking status: Never Smoker   Smokeless tobacco: Never Used  Substance and Sexual Activity   Alcohol use: No   Drug use: No   Sexual activity: Not on file  Lifestyle   Physical activity:    Days per week: Not on file    Minutes per session: Not on file   Stress: Not on file  Relationships   Social connections:    Talks on phone: Not on file    Gets together: Not on file     Attends religious service: Not on file    Active member of club or organization: Not on file    Attends meetings of clubs or organizations: Not on file    Relationship status: Not on file   Intimate partner violence:    Fear of current or ex partner: Not on file    Emotionally abused: Not on file    Physically abused: Not on file    Forced sexual activity: Not on file  Other Topics Concern   Not on file  Social History Narrative   Not on file    Medications:   Current Outpatient Medications on File Prior to Visit  Medication Sig Dispense Refill   aspirin EC 81 MG EC tablet Take 1 tablet (81 mg total) by mouth daily. 30 tablet 0   Multiple Vitamin (MULTI-VITAMIN PO) Take by mouth.     trolamine salicylate (ASPERCREME) 10 % cream Apply 1 application topically as needed (joint pain).     No current facility-administered medications on file prior to visit.     Allergies:   Allergies  Allergen Reactions   Sulfonamide Derivatives     Physical Exam General: well developed, well nourished pleasant elderly Caucasian lady, seated, in no evident distress Head: head normocephalic and atraumatic.   Neck: supple with no carotid or supraclavicular bruits Cardiovascular: regular rate and rhythm, no murmurs Musculoskeletal: no deformity Skin:  no rash/petichiae Vascular:  Normal pulses all extremities  Neurologic Exam Mental Status: Awake and fully alert. Oriented to place and time. Recent and remote memory poor. Attention span, concentration and fund of knowledge diminished. Mood and affect appropriate.  Mini-Mental status exam not done.  Diminished recall 0/3.  Able to name only 2 animals with 4 legs.  Clock drawing 3/4. Cranial Nerves: Fundoscopic exam reveals sharp disc margins. Pupils equal, briskly reactive to light. Extraocular movements full without nystagmus. Visual fields full to confrontation. Hearing diminished mildly bilaterally.  Facial sensation intact. Face, tongue,  palate moves normally and symmetrically.  Motor: Normal bulk and tone. Normal strength in all tested extremity muscles. Sensory.: intact to touch , pinprick , position and vibratory sensation.  Coordination: Rapid alternating movements normal in all extremities. Finger-to-nose and heel-to-shin performed accurately bilaterally. Gait and Station: Arises from chair without difficulty. Stance is normal. Gait demonstrates normal stride length and balance . Able to heel, toe and tandem walk with moderate difficulty.  Reflexes: 1+ and symmetric. Toes downgoing.   NIHSS 1 Modified Rankin  0  ASSESSMENT: 85 year old Caucasian lady with right hemispheric TIA in January 2020 likely due to small vessel disease.  Vascular risk factors of hyperlipidemia and age only.  She also has longstanding underlying cognitive and memory difficulties which appears to have progressed into mild dementia     PLAN: I had  a long d/w patient, her husband and daughter about her recent TIA, memory loss and cognitive impairment, risk for recurrent stroke/TIAs, personally independently reviewed imaging studies and stroke evaluation results and answered questions.Continue aspirin 81 mg daily  for secondary stroke prevention and maintain strict control of hypertension with blood pressure goal below 130/90, diabetes with hemoglobin A1c goal below 6.5% and lipids with LDL cholesterol goal below 70 mg/dL. I also advised the patient to eat a healthy diet with plenty of whole grains, cereals, fruits and vegetables, exercise regularly and maintain ideal body weight.  Check memory panel labs, EEG.  I have encouraged her to increase participation in cognitively challenging activities like solving crossword puzzles, playing bridge and sudoku.  Trial of Aricept 5 mg daily for a month to be increased to 10 mg daily if tolerated without side effects.  We also discussed memory compensation strategies.  Followup in the future with me in 2 months or  call earlier if necessary. Antony Contras, MD  [facility] [address] [place], [place] [address]  Phone [phone] Fax [phone] Note: This document was prepared with digital dictation and possible smart phrase technology. Any transcriptional errors that result from this process are unintentional.

## 2018-11-02 LAB — DEMENTIA PANEL
Homocysteine: 10.4 umol/L (ref 0.0–21.3)
RPR Ser Ql: NONREACTIVE
TSH: 1.4 u[IU]/mL (ref 0.450–4.500)
Vitamin B-12: 1818 pg/mL — ABNORMAL HIGH (ref 232–1245)

## 2018-11-06 ENCOUNTER — Telehealth: Payer: Self-pay

## 2018-11-06 NOTE — Telephone Encounter (Signed)
Left vm for Manuela Schwartz pts daughter to call back about lab work. ------

## 2018-11-06 NOTE — Telephone Encounter (Signed)
Patients daughter returned call for results

## 2018-11-06 NOTE — Telephone Encounter (Signed)
I called pts daughter Manuela Schwartz to give dementia panel lab work results. I stated the lab work was normal. Manuela Schwartz verbalized understanding.

## 2018-11-06 NOTE — Telephone Encounter (Signed)
-----   Message from [name], MD sent at 11/02/2018  5:07 PM EDT ----- Crystal Chang inform the patient that lab work for reversible causes of memory loss was all normal

## 2018-11-20 ENCOUNTER — Telehealth: Payer: Self-pay

## 2018-11-20 NOTE — Telephone Encounter (Signed)
Left message for pt to give [place] a call regarding virtual visit on 11/22/18.

## 2018-11-22 ENCOUNTER — Other Ambulatory Visit: Payer: Self-pay

## 2018-11-22 ENCOUNTER — Telehealth (INDEPENDENT_AMBULATORY_CARE_PROVIDER_SITE_OTHER): Payer: PPO | Admitting: Internal Medicine

## 2018-11-22 ENCOUNTER — Encounter: Payer: Self-pay | Admitting: Internal Medicine

## 2018-11-22 DIAGNOSIS — I472 Ventricular tachycardia: Secondary | ICD-10-CM | POA: Diagnosis not present

## 2018-11-22 DIAGNOSIS — G459 Transient cerebral ischemic attack, unspecified: Secondary | ICD-10-CM

## 2018-11-22 DIAGNOSIS — I4729 Other ventricular tachycardia: Secondary | ICD-10-CM

## 2018-11-22 NOTE — Progress Notes (Signed)
Electrophysiology TeleHealth Note   Due to national recommendations of social distancing due to [place] 19, Audio  telehealth visit is felt to be most appropriate for this patient at this time.  Verbal consent obtained from patient today.   Date:  11/22/2018   ID:  Crystal Chang, DOB [birth year], MRN [id number]  Location: home  Provider location: [address], [place] Evaluation Performed: New patient consult  PCP:  Lajean Manes, MD   Electrophysiologist:  Dr Rayann Heman  Chief Complaint:  TIA  History of Present Illness:    Crystal Chang is a 85 y.o. female who presents via audio/video conferencing for a telehealth visit today.   She was in the hospital in January for TIA symptoms.  She was noted to have NSVT on telemetry.  She had a 30 day monitor obtained with plans to follow-up with me.  Today, she denies symptoms of palpitations, chest pain, shortness of breath, orthopnea, PND, lower extremity edema, claudication, dizziness, presyncope, syncope, bleeding, or neurologic sequela. The patient is tolerating medications without difficulties and is otherwise without complaint today.   she denies symptoms of cough, fevers, chills, or new SOB worrisome for COVID 19.   Past Medical History:  Diagnosis Date  . Anxiety   . Bradycardia    due to PVC resoloved after PVC ablation  . Cystitis   . DJD (degenerative joint disease)   . GERD (gastroesophageal reflux disease)   . Hypertension   . NSVT (nonsustained ventricular tachycardia) ([place])   . PVC (premature ventricular contraction)   . TIA (transient ischemic attack)     Past Surgical History:  Procedure Laterality Date  . KNEE SURGERY    . TOTAL ABDOMINAL HYSTERECTOMY      Current Outpatient Medications  Medication Sig Dispense Refill  . aspirin EC 81 MG EC tablet Take 1 tablet (81 mg total) by mouth daily. 30 tablet 0  . atorvastatin (LIPITOR) 40 MG tablet Take 1 tablet (40 mg total) by mouth daily. 30 tablet 3  .  donepezil (ARICEPT) 10 MG tablet Take 1 tablet (10 mg total) by mouth at bedtime. Start 5 mg daily x 4 weeks and then increase to 10 mg daily 30 tablet 3  . Multiple Vitamin (MULTI-VITAMIN PO) Take by mouth.    . trolamine salicylate (ASPERCREME) 10 % cream Apply 1 application topically as needed (joint pain).     No current facility-administered medications for this visit.     Allergies:   Sulfonamide derivatives   Social History:  The patient  reports that she has never smoked. She has never used smokeless tobacco. She reports that she does not drink alcohol or use drugs.   Family History:  The patient's  family history includes Heart attack in her sister; Heart disease in her father and sister; Stroke in her sister.    ROS:  Please see the history of present illness.   All other systems are personally reviewed and negative.    Exam:    Vital Signs:  There were no vitals taken for this visit.   Well sounding, no distress   Labs/Other Tests and Data Reviewed:    Recent Labs: 09/20/2018: ALT 12; BUN 9; Creatinine, Ser 0.74; Hemoglobin 13.9; Platelets 270; Potassium 4.2; Sodium 135 11/01/2018: TSH 1.400   Wt Readings from Last 3 Encounters:  11/01/18 153 lb (69.4 kg)     Other studies personally reviewed: Additional studies/ records that were reviewed today include: my notes from 2011  and recent hospital records  Review of the above records today demonstrates: as above  30 day monitor is personally reviewed  ASSESSMENT & PLAN:    1.  TIA Chart reviewed 30 day monitor reveals no afib Given advanced age, she is not interested in ILR at this time.  2. NSVT No episodes on recent event monitor No symptoms  3. COVID screen The patient does not have any symptoms that suggest any further testing/ screening at this time.  Social distancing reinforced today.  Follow-up:  As needed  Current medicines are reviewed at length with the patient today.   The patient does not have  concerns regarding her medicines.  The following changes were made today:  none  Labs/ tests ordered today include:  No orders of the defined types were placed in this encounter.   Patient Risk:  after full review of this patients clinical status, I feel that they are at high risk at this time.   Today, I have spent 22 minutes with the patient with telehealth technology discussing her recent hospitalization and TIA .    Signed, Thompson Grayer MD, [place] 11/22/2018    [address] [place] [address] [phone] (office) [phone] (fax)

## 2018-12-06 ENCOUNTER — Other Ambulatory Visit: Payer: PPO

## 2019-01-18 ENCOUNTER — Telehealth: Payer: Self-pay

## 2019-01-18 NOTE — Telephone Encounter (Signed)
I called Crystal Chang that pts visit will be change to video due to COVID 19. I receive verbal consent to do video and to file insurance. Crystal Chang gave me her email address of [email or web address]. Her cell number is [phone] and carrier is [employer]. I explained she will be text the link and email link day before visit I explain the doxy process.She knows to click link 10 minutes prior to visit.

## 2019-01-23 NOTE — Telephone Encounter (Signed)
Link email and text to daughter. I call Manuela Schwartz and she receive both.

## 2019-01-24 ENCOUNTER — Other Ambulatory Visit: Payer: Self-pay

## 2019-01-24 ENCOUNTER — Ambulatory Visit (INDEPENDENT_AMBULATORY_CARE_PROVIDER_SITE_OTHER): Payer: PPO | Admitting: Neurology

## 2019-01-24 ENCOUNTER — Encounter: Payer: Self-pay | Admitting: Neurology

## 2019-01-24 DIAGNOSIS — R413 Other amnesia: Secondary | ICD-10-CM

## 2019-01-24 NOTE — Progress Notes (Signed)
Virtual Visit via Video Note  I connected with Crystal Chang on 01/24/19 at 10:30 AM EDT by a video enabled telemedicine application and verified that I am speaking with the correct person using two identifiers.  Location: Patient: at home with daughter Provider: at [facility] office   I discussed the limitations of evaluation and management by telemedicine and the availability of in person appointments. The patient expressed understanding and agreed to proceed.  This visit was performed using face time for audio and vision with the patient's daughter who was present at her bedside throughout who facilitated this visit.  History of Present Illness: Crystal Chang is seen today for virtual video follow-up visit after initial consultation visit on 11/01/2018.  She states that she has been able to tolerate increase Aricept dose of 10 mg daily without any GI side effects or dizziness.  Her husband in fact noted some initial improvement in her memory and cognition.  The patient herself feels she is noted no difference.  She still mostly independent in most activities of daily living.  She denies any delusions, hallucinations, agitation or unsafe behavior.  She has not been doing any activities which are cognitively challenging on a regular basis.  She remains on aspirin which is tolerating well without any significant bruising or bleeding.  Her blood pressure remains under good control.  She did have memory panel labs done at last visit all of which were normal.  EEG was ordered but has not yet been scheduled due to the coronavirus pandemic.  She has no new complaints.   Observations/Objective: Physical and neurological exams are limited due to constraints of virtual video visit.  Pleasant elderly Caucasian lady not in distress.  She is awake alert oriented to place and person.  She has diminished recall 0/3.  Diminished attention registration.  Clock drawing score is 2/4.  She is able to name only 2 animals which walk on  4 legs.  A complete Mini-Mental status exam was not done today.  Speech and language appear normal.  Extraocular movements are full range without nystagmus.  Face is symmetric without weakness.  Tongue is midline.  Motor system exam shows symmetric upper and lower extremity strength without focal weakness.  Fine finger movements are symmetric.  She is able to get up easily.  She walks with a steady gait.  She can stand on either foot unsupported and on her toes but not on her heels.  Assessment and Plan: 85 year old lady with right hemispheric TIA from small vessel disease in January 2020 with vascular risk factors of hyperlipidemia and age only.  She also has mild dementia likely of Alzheimer's type and has shown some response to Aricept. Plan continue Aricept 10 mg daily for her cognitive impairment and I have encouraged her to increase participation in cognitively challenging activities like solving crossword puzzles, playing bridge and sudoku.  We also discussed memory compensation strategies.  She will keep her appointment for EEG.  Continue aspirin for stroke prevention and maintain strict control of hypertension with blood pressure goal below 130/90, diabetes with hemoglobin A1c goal below 6.5% and lipids with LDL cholesterol goal below 70 mg/dL. I also advised the patient to eat a healthy diet with plenty of whole grains, cereals, fruits and vegetables, exercise regularly and maintain ideal body weigh Follow Up Instructions: Keep appointment for EEG.  Return for in person office follow-up visit in 2 months for more detailed cognitive testing at that visit.   I discussed the assessment and  treatment plan with the patient. The patient was provided an opportunity to ask questions and all were answered. The patient agreed with the plan and demonstrated an understanding of the instructions.   The patient was advised to call back or seek an in-person evaluation if the symptoms worsen or if the condition  fails to improve as anticipated.  I provided 25 minutes of non-face-to-face time during this encounter.   Antony Contras, MD

## 2019-02-02 ENCOUNTER — Other Ambulatory Visit: Payer: Self-pay | Admitting: Neurology

## 2019-02-08 ENCOUNTER — Other Ambulatory Visit: Payer: Self-pay

## 2019-02-08 MED ORDER — DONEPEZIL HCL 10 MG PO TABS: 10.0000 mg | ORAL_TABLET | Freq: Every day | ORAL | 1 refills | Status: DC

## 2019-02-08 MED ORDER — ATORVASTATIN CALCIUM 40 MG PO TABS: 40.0000 mg | ORAL_TABLET | Freq: Every day | ORAL | 1 refills | Status: DC

## 2019-03-05 ENCOUNTER — Other Ambulatory Visit: Payer: Self-pay

## 2019-03-05 ENCOUNTER — Other Ambulatory Visit (INDEPENDENT_AMBULATORY_CARE_PROVIDER_SITE_OTHER): Payer: PPO | Admitting: Neurology

## 2019-03-05 DIAGNOSIS — R410 Disorientation, unspecified: Secondary | ICD-10-CM | POA: Diagnosis not present

## 2019-03-05 DIAGNOSIS — E7800 Pure hypercholesterolemia, unspecified: Secondary | ICD-10-CM | POA: Diagnosis not present

## 2019-03-05 DIAGNOSIS — Z0000 Encounter for general adult medical examination without abnormal findings: Secondary | ICD-10-CM | POA: Diagnosis not present

## 2019-03-05 DIAGNOSIS — I10 Essential (primary) hypertension: Secondary | ICD-10-CM | POA: Diagnosis not present

## 2019-03-05 DIAGNOSIS — Z1389 Encounter for screening for other disorder: Secondary | ICD-10-CM | POA: Diagnosis not present

## 2019-03-07 ENCOUNTER — Other Ambulatory Visit (HOSPITAL_COMMUNITY): Payer: Self-pay | Admitting: Neurology

## 2019-03-07 DIAGNOSIS — R413 Other amnesia: Secondary | ICD-10-CM

## 2019-03-12 ENCOUNTER — Telehealth: Payer: Self-pay

## 2019-03-12 NOTE — Telephone Encounter (Signed)
Notes recorded by Marval Regal, RN on 03/12/2019 at 10:42 AM EDT  I called pts daughter Manuela Schwartz that EEG was normal, and no evidence of seizure activity. Manuela Schwartz verbalized understanding.

## 2019-03-12 NOTE — Telephone Encounter (Signed)
-----   Message from [name], MD sent at 03/09/2019  2:59 PM EDT ----- Crystal Chang inform the patient that EEG study was normal and there was no evidence of seizure activity.

## 2019-04-05 ENCOUNTER — Ambulatory Visit (INDEPENDENT_AMBULATORY_CARE_PROVIDER_SITE_OTHER): Payer: PPO | Admitting: Neurology

## 2019-04-05 ENCOUNTER — Encounter: Payer: Self-pay | Admitting: Neurology

## 2019-04-05 ENCOUNTER — Other Ambulatory Visit: Payer: Self-pay

## 2019-04-05 VITALS — BP 103/69 | HR 80 | Temp 96.0°F | Wt 151.0 lb

## 2019-04-05 DIAGNOSIS — G301 Alzheimer's disease with late onset: Secondary | ICD-10-CM | POA: Diagnosis not present

## 2019-04-05 DIAGNOSIS — F0280 Dementia in other diseases classified elsewhere without behavioral disturbance: Secondary | ICD-10-CM

## 2019-04-05 MED ORDER — MEMANTINE HCL 28 X 5 MG & 21 X 10 MG PO TABS: ORAL_TABLET | ORAL | 12 refills | Status: DC

## 2019-04-05 NOTE — Patient Instructions (Signed)
I had a long d/w patient and her daughter and recommend shw continue Aricept 10 mg daily for her cognitive impairment and I have encouraged her to increase participation in cognitively challenging activities like solving crossword puzzles, playing bridge and sudoku.  We also discussed memory compensation strategies.  Add namenda starter pack to help with memory as well if tolerated  Continue aspirin for stroke prevention and maintain strict control of hypertension with blood pressure goal below 130/90, diabetes with hemoglobin A1c goal below 6.5% and lipids with LDL cholesterol goal below 70 mg/dL. I also advised the patient to eat a healthy diet with plenty of whole grains, cereals, fruits and vegetables, exercise regularly and maintain ideal body weight.Return for f/u in 2 months or call earlier if needed. Memantine Tablets What is this medicine? MEMANTINE (MEM an teen) is used to treat dementia caused by Alzheimer's disease. This medicine may be used for other purposes; ask your health care provider or pharmacist if you have questions. COMMON BRAND NAME(S): Namenda What should I tell my health care provider before I take this medicine? They need to know if you have any of these conditions:  difficulty passing urine  kidney disease  liver disease  seizures  an unusual or allergic reaction to memantine, other medicines, foods, dyes, or preservatives  pregnant or trying to get pregnant  breast-feeding How should I use this medicine? Take this medicine by mouth with a glass of water. Follow the directions on the prescription label. You may take this medicine with or without food. Take your doses at regular intervals. Do not take your medicine more often than directed. Continue to take your medicine even if you feel better. Do not stop taking except on the advice of your doctor or health care professional. Talk to your pediatrician regarding the use of this medicine in children. Special care  may be needed. Overdosage: If you think you have taken too much of this medicine contact a poison control center or emergency room at once. NOTE: This medicine is only for you. Do not share this medicine with others. What if I miss a dose? If you miss a dose, take it as soon as you can. If it is almost time for your next dose, take only that dose. Do not take double or extra doses. If you do not take your medicine for several days, contact your health care provider. Your dose may need to be changed. What may interact with this medicine?  acetazolamide  amantadine  cimetidine  dextromethorphan  dofetilide  hydrochlorothiazide  ketamine  metformin  methazolamide  quinidine  ranitidine  sodium bicarbonate  triamterene This list may not describe all possible interactions. Give your health care provider a list of all the medicines, herbs, non-prescription drugs, or dietary supplements you use. Also tell them if you smoke, drink alcohol, or use illegal drugs. Some items may interact with your medicine. What should I watch for while using this medicine? Visit your doctor or health care professional for regular checks on your progress. Check with your doctor or health care professional if there is no improvement in your symptoms or if they get worse. You may get drowsy or dizzy. Do not drive, use machinery, or do anything that needs mental alertness until you know how this drug affects you. Do not stand or sit up quickly, especially if you are an older patient. This reduces the risk of dizzy or fainting spells. Alcohol can make you more drowsy and dizzy. Avoid alcoholic drinks.  What side effects may I notice from receiving this medicine? Side effects that you should report to your doctor or health care professional as soon as possible:  allergic reactions like skin rash, itching or hives, swelling of the face, lips, or tongue  agitation or a feeling of restlessness  depressed  mood  dizziness  hallucinations  redness, blistering, peeling or loosening of the skin, including inside the mouth  seizures  vomiting Side effects that usually do not require medical attention (report to your doctor or health care professional if they continue or are bothersome):  constipation  diarrhea  headache  nausea  trouble sleeping This list may not describe all possible side effects. Call your doctor for medical advice about side effects. You may report side effects to FDA at 1-800-FDA-1088. Where should I keep my medicine? Keep out of the reach of children. Store at room temperature between 15 degrees and 30 degrees C (59 degrees and 86 degrees F). Throw away any unused medicine after the expiration date. NOTE: This sheet is a summary. It may not cover all possible information. If you have questions about this medicine, talk to your doctor, pharmacist, or health care provider.  2020 Elsevier/Gold Standard (2013-05-28 14:10:42)

## 2019-04-05 NOTE — Progress Notes (Signed)
[facility] [address] [place]. [place] [address] [phone]       OFFICE FOLLOW-UP NOTE  [name] Date of Birth:  1930-08-15 Medical Record Number:  [id number]   HPI: Initial Virtual video visit 01/24/19 : Crystal Chang is seen today for virtual video follow-up visit after initial consultation visit on 11/01/2018.  She states that she has been able to tolerate increase Aricept dose of 10 mg daily without any GI side effects or dizziness.  Her husband in fact noted some initial improvement in her memory and cognition.  The patient herself feels she is noted no difference.  She still mostly independent in most activities of daily living.  She denies any delusions, hallucinations, agitation or unsafe behavior.  She has not been doing any activities which are cognitively challenging on a regular basis.  She remains on aspirin which is tolerating well without any significant bruising or bleeding.  Her blood pressure remains under good control.  She did have memory panel labs done at last visit all of which were normal.  EEG was ordered but has not yet been scheduled due to the coronavirus pandemic.  She has no new complaints. Update 04/05/2019 : Crystal Chang returns for follow-up today after initial video visit 2 months ago.  She is accompanied by her daughter.  She is tolerating Aricept 10 mg daily without significant GI or CNS side effects.  However she continues to have short-term memory difficulties which is not yet good.  She does get irritable and upset easily but there has been no agitation or violent behavior.  She does not have any delusions hallucinations.  She can walk fairly well has not fallen or hurt herself.  She has never gotten lost.  She did undergo EEG on 03/07/2019 which was normal.  She had lab work for reversible causes of memory loss on 11/01/2018 previously which were also all unremarkable.  Patient is living at home with her husband who is mostly doing most cooking  and cleaning.  The patient is independent for her self-care needs and can dress and bathe herself and wear her clothes.  She is not doing any bills.  She does not go out of the house a lot.  She is not driving.  On Mini-Mental status exam testing today she scored 22/30 with deficits in orientation recall , poor animal naming and copying figures.   ROS:   14 system review of systems is positive for memory loss, confusion, agitation, irritability and all other systems negative  PMH:  Past Medical History:  Diagnosis Date  . Anxiety   . Bradycardia    due to PVC resoloved after PVC ablation  . Cystitis   . DJD (degenerative joint disease)   . GERD (gastroesophageal reflux disease)   . Hypertension   . NSVT (nonsustained ventricular tachycardia) ([place])   . PVC (premature ventricular contraction)   . TIA (transient ischemic attack)     Social History:  Social History   Socioeconomic History  . Marital status: Married    Spouse name: Not on file  . Number of children: Not on file  . Years of education: Not on file  . Highest education level: Not on file  Occupational History  . Not on file  Social Needs  . Financial resource strain: Not on file  . Food insecurity    Worry: Not on file    Inability: Not on file  . Transportation needs    Medical: Not on  file    Non-medical: Not on file  Tobacco Use  . Smoking status: Never Smoker  . Smokeless tobacco: Never Used  Substance and Sexual Activity  . Alcohol use: No  . Drug use: No  . Sexual activity: Not on file  Lifestyle  . Physical activity    Days per week: Not on file    Minutes per session: Not on file  . Stress: Not on file  Relationships  . Social Herbalist on phone: Not on file    Gets together: Not on file    Attends religious service: Not on file    Active member of club or organization: Not on file    Attends meetings of clubs or organizations: Not on file    Relationship status: Not on file  .  Intimate partner violence    Fear of current or ex partner: Not on file    Emotionally abused: Not on file    Physically abused: Not on file    Forced sexual activity: Not on file  Other Topics Concern  . Not on file  Social History Narrative  . Not on file    Medications:   Current Outpatient Medications on File Prior to Visit  Medication Sig Dispense Refill  . aspirin EC 81 MG EC tablet Take 1 tablet (81 mg total) by mouth daily. 30 tablet 0  . atorvastatin (LIPITOR) 40 MG tablet Take 1 tablet (40 mg total) by mouth daily. 90 tablet 1  . donepezil (ARICEPT) 10 MG tablet Take 1 tablet (10 mg total) by mouth at bedtime. Start 5 mg daily x 4 weeks and then increase to 10 mg daily 90 tablet 1  . Multiple Vitamin (MULTI-VITAMIN PO) Take by mouth.    . trolamine salicylate (ASPERCREME) 10 % cream Apply 1 application topically as needed (joint pain).    . vitamin C (ASCORBIC ACID) 500 MG tablet Take 500 mg by mouth daily.     No current facility-administered medications on file prior to visit.     Allergies:   Allergies  Allergen Reactions  . Sulfonamide Derivatives     Physical Exam General: well developed, well nourished elderly Caucasian lady, seated, in no evident distress Head: head normocephalic and atraumatic.  Neck: supple with no carotid or supraclavicular bruits Cardiovascular: regular rate and rhythm, no murmurs Musculoskeletal: no deformity Skin:  no rash/petichiae Vascular:  Normal pulses all extremities Vitals:   04/05/19 0950  BP: 103/69  Pulse: 80  Temp: (!) 96 F (35.6 C)   Neurologic Exam Mental Status: Awake and fully alert. Oriented to place and time. Recent and remote memory poor. Attention span, concentration and fund of knowledge diminished mood and affect appropriate.  Mini-Mental status exam she scored 22/30 with deficits in orientation recall and copying figures.  She was able to name only 5 animals which can walk on 4 legs.  Clock drawing 3/4.  Cranial Nerves: Fundoscopic exam reveals sharp disc margins. Pupils equal, briskly reactive to light. Extraocular movements full without nystagmus. Visual fields full to confrontation. Hearing intact. Facial sensation intact. Face, tongue, palate moves normally and symmetrically.  Motor: Normal bulk and tone. Normal strength in all tested extremity muscles. Sensory.: intact to touch ,pinprick .position and vibratory sensation.  Coordination: Rapid alternating movements normal in all extremities. Finger-to-nose and heel-to-shin performed accurately bilaterally. Gait and Station: Arises from chair without difficulty. Stance is normal. Gait demonstrates normal stride length and balance . Able to heel, toe and tandem  walk with moderate difficulty.  Reflexes: 1+ and symmetric. Toes downgoing.       ASSESSMENT: 85 year old lady with right hemispheric TIA from small vessel disease in January 2020 with vascular risk factors of hyperlipidemia and age only.  She also has mild dementia likely of Alzheimer's type and has shown minimal response to Aricept.     PLAN: I had a long d/w patient and her daughter and recommend shw continue Aricept 10 mg daily for her cognitive impairment and I have encouraged her to increase participation in cognitively challenging activities like solving crossword puzzles, playing bridge and sudoku.  We also discussed memory compensation strategies.  Add namenda starter pack to help with memory as well if tolerated  Continue aspirin for stroke prevention and maintain strict control of hypertension with blood pressure goal below 130/90, diabetes with hemoglobin A1c goal below 6.5% and lipids with LDL cholesterol goal below 70 mg/dL. I also advised the patient to eat a healthy diet with plenty of whole grains, cereals, fruits and vegetables, exercise regularly and maintain ideal body weight.Return for f/u in 2 months or call earlier if needed. Greater than 50% of time during this 25  minute visit was spent on counseling,explanation of diagnosis of dementia, planning of further management, discussion with patient and family and coordination of care Antony Contras, MD  [facility] [address] [place], [place] [address]  Phone [phone] Fax [phone] Note: This document was prepared with digital dictation and possible smart phrase technology. Any transcriptional errors that result from this process are unintentional

## 2019-04-27 DIAGNOSIS — R300 Dysuria: Secondary | ICD-10-CM | POA: Diagnosis not present

## 2019-05-09 DIAGNOSIS — N952 Postmenopausal atrophic vaginitis: Secondary | ICD-10-CM | POA: Diagnosis not present

## 2019-05-09 DIAGNOSIS — R300 Dysuria: Secondary | ICD-10-CM | POA: Diagnosis not present

## 2019-05-10 DIAGNOSIS — E7800 Pure hypercholesterolemia, unspecified: Secondary | ICD-10-CM | POA: Diagnosis not present

## 2019-05-10 DIAGNOSIS — I10 Essential (primary) hypertension: Secondary | ICD-10-CM | POA: Diagnosis not present

## 2019-05-10 DIAGNOSIS — Z79899 Other long term (current) drug therapy: Secondary | ICD-10-CM | POA: Diagnosis not present

## 2019-06-04 ENCOUNTER — Other Ambulatory Visit: Payer: Self-pay | Admitting: Neurology

## 2019-06-13 ENCOUNTER — Other Ambulatory Visit: Payer: Self-pay

## 2019-06-13 ENCOUNTER — Ambulatory Visit: Payer: PPO | Admitting: Neurology

## 2019-06-13 ENCOUNTER — Encounter: Payer: Self-pay | Admitting: Neurology

## 2019-06-13 VITALS — BP 124/67 | HR 68 | Temp 97.9°F | Ht 61.0 in | Wt 152.8 lb

## 2019-06-13 DIAGNOSIS — G301 Alzheimer's disease with late onset: Secondary | ICD-10-CM

## 2019-06-13 DIAGNOSIS — F0280 Dementia in other diseases classified elsewhere without behavioral disturbance: Secondary | ICD-10-CM

## 2019-06-13 NOTE — Progress Notes (Signed)
[facility] [address] [place]. [place] [address] [phone]       OFFICE FOLLOW-UP NOTE  [name] Date of Birth:  May 12, 1930 Medical Record Number:  [id number]   HPI: Initial Virtual video visit 01/24/19 : [name] is seen today for virtual video follow-up visit after initial consultation visit on 11/01/2018.  She states that she has been able to tolerate increase Aricept dose of 10 mg daily without any GI side effects or dizziness.  Her husband in fact noted some initial improvement in her memory and cognition.  The patient herself feels she is noted no difference.  She still mostly independent in most activities of daily living.  She denies any delusions, hallucinations, agitation or unsafe behavior.  She has not been doing any activities which are cognitively challenging on a regular basis.  She remains on aspirin which is tolerating well without any significant bruising or bleeding.  Her blood pressure remains under good control.  She did have memory panel labs done at last visit all of which were normal.  EEG was ordered but has not yet been scheduled due to the coronavirus pandemic.  She has no new complaints. Update 04/05/2019 : Ms. [name] returns for follow-up today after initial video visit 2 months ago.  She is accompanied by her daughter.  She is tolerating Aricept 10 mg daily without significant GI or CNS side effects.  However she continues to have short-term memory difficulties which is not yet good.  She does get irritable and upset easily but there has been no agitation or violent behavior.  She does not have any delusions hallucinations.  She can walk fairly well has not fallen or hurt herself.  She has never gotten lost.  She did undergo EEG on 03/07/2019 which was normal.  She had lab work for reversible causes of memory loss on 11/01/2018 previously which were also all unremarkable.  Patient is living at home with her husband who is mostly doing most cooking  and cleaning.  The patient is independent for her self-care needs and can dress and bathe herself and wear her clothes.  She is not doing any bills.  She does not go out of the house a lot.  She is not driving.  On Mini-Mental status exam testing today she scored 22/30 with deficits in orientation recall , poor animal naming and copying figures.   Update 06/13/2019 : She returns for follow-up after last visit with me 2 months ago.  She feels she is about the same.  Daughter states she continues to have short-term memory difficulties but is otherwise doing well.  She is still on only on Aricept alone and Namenda has not yet been filled by her pharmacy for unknown reason.  She is living at home with her husband.  She is mostly independent with activities of daily living but the daughters help out with doing groceries and finances.  There has been no delusions, hallucinations or unsafe behavior.  On Mini-Mental status exam today she scored 26/30 which actually represent improvement from 22/30 from last visit.  She has no new complaints.   ROS:   14 system review of systems is positive for memory loss, confusion,   irritability and all other systems negative  PMH:  Past Medical History:  Diagnosis Date   Anxiety    Bradycardia    due to PVC resoloved after PVC ablation   Cystitis    DJD (degenerative joint disease)    GERD (  gastroesophageal reflux disease)    Hypertension    NSVT (nonsustained ventricular tachycardia) (HCC)    PVC (premature ventricular contraction)    TIA (transient ischemic attack)     Social History:  Social History   Socioeconomic History   Marital status: Married    Spouse name: Not on file   Number of children: Not on file   Years of education: Not on file   Highest education level: Not on file  Occupational History   Not on file  Social Needs   Financial resource strain: Not on file   Food insecurity    Worry: Not on file    Inability: Not on  file   Transportation needs    Medical: Not on file    Non-medical: Not on file  Tobacco Use   Smoking status: Never Smoker   Smokeless tobacco: Never Used  Substance and Sexual Activity   Alcohol use: No   Drug use: No   Sexual activity: Not on file  Lifestyle   Physical activity    Days per week: Not on file    Minutes per session: Not on file   Stress: Not on file  Relationships   Social connections    Talks on phone: Not on file    Gets together: Not on file    Attends religious service: Not on file    Active member of club or organization: Not on file    Attends meetings of clubs or organizations: Not on file    Relationship status: Not on file   Intimate partner violence    Fear of current or ex partner: Not on file    Emotionally abused: Not on file    Physically abused: Not on file    Forced sexual activity: Not on file  Other Topics Concern   Not on file  Social History Narrative   Not on file    Medications:   Current Outpatient Medications on File Prior to Visit  Medication Sig Dispense Refill   aspirin EC 81 MG EC tablet Take 1 tablet (81 mg total) by mouth daily. 30 tablet 0   atorvastatin (LIPITOR) 40 MG tablet Take 1 tablet (40 mg total) by mouth daily. 90 tablet 1   donepezil (ARICEPT) 10 MG tablet Take 1 tablet (10 mg total) by mouth at bedtime. Start 5 mg daily x 4 weeks and then increase to 10 mg daily 90 tablet 1   memantine (NAMENDA TITRATION PAK) tablet pack 5 mg/day for =1 week; 5 mg twice daily for =1 week; 15 mg/day given in 5 mg and 10 mg separated doses for =1 week; then 10 mg twice daily 49 tablet 12   Multiple Vitamin (MULTI-VITAMIN PO) Take by mouth.     trolamine salicylate (ASPERCREME) 10 % cream Apply 1 application topically as needed (joint pain).     vitamin C (ASCORBIC ACID) 500 MG tablet Take 500 mg by mouth daily.     No current facility-administered medications on file prior to visit.     Allergies:     Allergies  Allergen Reactions   Sulfonamide Derivatives     Physical Exam General: well developed, well nourished elderly Caucasian lady, seated, in no evident distress Head: head normocephalic and atraumatic.  Neck: supple with no carotid or supraclavicular bruits Cardiovascular: regular rate and rhythm, no murmurs Musculoskeletal: no deformity Skin:  no rash/petichiae Vascular:  Normal pulses all extremities Vitals:   06/13/19 1028  BP: 124/67  Pulse: 68  Temp:  97.9 F (36.6 C)   Neurologic Exam Mental Status: Awake and fully alert. Oriented to place and time. Recent and remote memory poor. Attention span, concentration and fund of knowledge diminished mood and affect appropriate.  Mini-Mental status exam she scored 26/30 ( last visit 22/30 ) with deficits in orientation recall and copying figures.  She was able to name only 5 animals which can walk on 4 legs.  Clock drawing 3/4. Cranial Nerves: Fundoscopic exam not done. Pupils equal, briskly reactive to light. Extraocular movements full without nystagmus. Visual fields full to confrontation. Hearing intact. Facial sensation intact. Face, tongue, palate moves normally and symmetrically.  Motor: Normal bulk and tone. Normal strength in all tested extremity muscles. Sensory.: intact to touch ,pinprick .position and vibratory sensation.  Coordination: Rapid alternating movements normal in all extremities. Finger-to-nose and heel-to-shin performed accurately bilaterally. Gait and Station: Arises from chair without difficulty. Stance is normal. Gait demonstrates normal stride length and balance . Able to heel, toe and tandem walk with moderate difficulty.  Reflexes: 1+ and symmetric. Toes downgoing.       ASSESSMENT: 85 year old lady with right hemispheric TIA from small vessel disease in January 2020 with vascular risk factors of hyperlipidemia and age only.  She also has mild dementia likely of Alzheimer's type and has shown some  response to Aricept.     PLAN: I had a long d/w patient and her daughter and recommend she continue Aricept 10 mg daily for her cognitive impairment and I have encouraged her to increase participation in cognitively challenging activities like solving crossword puzzles, playing bridge and sudoku. We also discussed memory compensation strategies. Add namenda starter pack to help with memory as well if tolerated Continue aspirin for stroke prevention and maintain strict control of hypertension with blood pressure goal below 130/90, diabetes with hemoglobin A1c goal below 6.5% and lipids with LDL cholesterol goal below 70 mg/dL. I also advised the patient to eat a healthy diet with plenty of whole grains, cereals, fruits and vegetables, exercise regularly and maintain ideal body weight.Return for f/u in 3 months or call earlier if needed.. Greater than 50% of time during this 30 minute visit was spent on counseling,explanation of diagnosis of dementia, planning of further management, discussion with patient and family and coordination of care Antony Contras, MD  [facility] [address] [place], [place] [address]  Phone [phone] Fax [phone] Note: This document was prepared with digital dictation and possible smart phrase technology. Any transcriptional errors that result from this process are unintentional

## 2019-06-13 NOTE — Patient Instructions (Signed)
I had a long d/w patient and her daughter and recommend she continue Aricept 10 mg daily for her cognitive impairment and I have encouraged her to increase participation in cognitively challenging activities like solving crossword puzzles, playing bridge and sudoku. We also discussed memory compensation strategies. Add namenda starter pack to help with memory as well if tolerated Continue aspirin for stroke prevention and maintain strict control of hypertension with blood pressure goal below 130/90, diabetes with hemoglobin A1c goal below 6.5% and lipids with LDL cholesterol goal below 70 mg/dL. I also advised the patient to eat a healthy diet with plenty of whole grains, cereals, fruits and vegetables, exercise regularly and maintain ideal body weight.Return for f/u in 3 months or call earlier if needed. Memory Compensation Strategies  1. Use "WARM" strategy.  W= write it down  A= associate it  R= repeat it  M= make a mental note  2.   You can keep a Social worker.  Use a 3-ring notebook with sections for the following: calendar, important names and phone numbers,  medications, doctors' names/phone numbers, lists/reminders, and a section to journal what you did  each day.   3.    Use a calendar to write appointments down.  4.    Write yourself a schedule for the day.  This can be placed on the calendar or in a separate section of the Memory Notebook.  Keeping a  regular schedule can help memory.  5.    Use medication organizer with sections for each day or morning/evening pills.  You may need help loading it  6.    Keep a basket, or pegboard by the door.  Place items that you need to take out with you in the basket or on the pegboard.  You may also want to  include a message board for reminders.  7.    Use sticky notes.  Place sticky notes with reminders in a place where the task is performed.  For example: " turn off the  stove" placed by the stove, "lock the door" placed on the door  at eye level, " take your medications" on  the bathroom mirror or by the place where you normally take your medications.  8.    Use alarms/timers.  Use while cooking to remind yourself to check on food or as a reminder to take your medicine, or as a  reminder to make a call, or as a reminder to perform another task, etc.

## 2019-06-26 DIAGNOSIS — N3010 Interstitial cystitis (chronic) without hematuria: Secondary | ICD-10-CM | POA: Diagnosis not present

## 2019-06-26 DIAGNOSIS — N94819 Vulvodynia, unspecified: Secondary | ICD-10-CM | POA: Diagnosis not present

## 2019-07-26 ENCOUNTER — Other Ambulatory Visit: Payer: Self-pay

## 2019-07-26 ENCOUNTER — Other Ambulatory Visit: Payer: Self-pay | Admitting: Neurology

## 2019-07-26 MED ORDER — MEMANTINE HCL 10 MG PO TABS: 10.0000 mg | ORAL_TABLET | Freq: Two times a day (BID) | ORAL | 1 refills | Status: DC

## 2019-07-26 NOTE — Progress Notes (Signed)
namenda order place.

## 2019-09-01 ENCOUNTER — Other Ambulatory Visit: Payer: Self-pay | Admitting: Neurology

## 2019-09-19 ENCOUNTER — Ambulatory Visit: Payer: PPO | Admitting: Neurology

## 2019-09-24 DIAGNOSIS — Z79899 Other long term (current) drug therapy: Secondary | ICD-10-CM | POA: Diagnosis not present

## 2019-09-24 DIAGNOSIS — R0789 Other chest pain: Secondary | ICD-10-CM | POA: Diagnosis not present

## 2019-09-24 DIAGNOSIS — R42 Dizziness and giddiness: Secondary | ICD-10-CM | POA: Diagnosis not present

## 2019-11-28 ENCOUNTER — Encounter: Payer: Self-pay | Admitting: Neurology

## 2019-11-28 ENCOUNTER — Ambulatory Visit: Payer: PPO | Admitting: Neurology

## 2019-11-28 ENCOUNTER — Other Ambulatory Visit: Payer: Self-pay

## 2019-11-28 VITALS — BP 140/76 | HR 80 | Temp 97.7°F | Ht 61.0 in | Wt 151.4 lb

## 2019-11-28 DIAGNOSIS — G309 Alzheimer's disease, unspecified: Secondary | ICD-10-CM | POA: Insufficient documentation

## 2019-11-28 DIAGNOSIS — F0280 Dementia in other diseases classified elsewhere without behavioral disturbance: Secondary | ICD-10-CM

## 2019-11-28 DIAGNOSIS — R413 Other amnesia: Secondary | ICD-10-CM

## 2019-11-28 DIAGNOSIS — G301 Alzheimer's disease with late onset: Secondary | ICD-10-CM

## 2019-11-28 NOTE — Patient Instructions (Signed)
I had a long d/w patient and her daughter and recommend she continue Aricept 10 mg daily and Namenda 10 mg twice daily for her cognitive impairment and I have encouraged her to increase participation in cognitively challenging activities like solving crossword puzzles, playing bridge and sudoku. We also discussed memory compensation strategies. . She may also consider possible participation in the [place] 2 early dementia trial if interested and will be given information to review at home and decide. Continue aspirin for stroke prevention and maintain strict control of hypertension with blood pressure goal below 130/90, diabetes with hemoglobin A1c goal below 6.5% and lipids with LDL cholesterol goal below 70 mg/dL. I also advised the patient to eat a healthy diet with plenty of whole grains, cereals, fruits and vegetables, exercise regularly and maintain ideal body weight.Return for f/u in 6 months or call earlier if needed.Marland Kitchen

## 2019-11-28 NOTE — Progress Notes (Signed)
[facility] [address] [place]. [place] [address] [phone]       OFFICE FOLLOW-UP NOTE  [name] Date of Birth:  February 12, 1930 Medical Record Number:  [id number]   HPI: Initial Virtual video visit 01/24/19 : Crystal Chang is seen today for virtual video follow-up visit after initial consultation visit on 11/01/2018.  She states that she has been able to tolerate increase Aricept dose of 10 mg daily without any GI side effects or dizziness.  Her husband in fact noted some initial improvement in her memory and cognition.  The patient herself feels she is noted no difference.  She still mostly independent in most activities of daily living.  She denies any delusions, hallucinations, agitation or unsafe behavior.  She has not been doing any activities which are cognitively challenging on a regular basis.  She remains on aspirin which is tolerating well without any significant bruising or bleeding.  Her blood pressure remains under good control.  She did have memory panel labs done at last visit all of which were normal.  EEG was ordered but has not yet been scheduled due to the coronavirus pandemic.  She has no new complaints. Update 04/05/2019 : Crystal Chang returns for follow-up today after initial video visit 2 months ago.  She is accompanied by her daughter.  She is tolerating Aricept 10 mg daily without significant GI or CNS side effects.  However she continues to have short-term memory difficulties which is not yet good.  She does get irritable and upset easily but there has been no agitation or violent behavior.  She does not have any delusions hallucinations.  She can walk fairly well has not fallen or hurt herself.  She has never gotten lost.  She did undergo EEG on 03/07/2019 which was normal.  She had lab work for reversible causes of memory loss on 11/01/2018 previously which were also all unremarkable.  Patient is living at home with her husband who is mostly doing most cooking  and cleaning.  The patient is independent for her self-care needs and can dress and bathe herself and wear her clothes.  She is not doing any bills.  She does not go out of the house a lot.  She is not driving.  On Mini-Mental status exam testing today she scored 22/30 with deficits in orientation recall , poor animal naming and copying figures.   Update 06/13/2019 : She returns for follow-up after last visit with me 2 months ago.  She feels she is about the same.  Daughter states she continues to have short-term memory difficulties but is otherwise doing well.  She is still on only on Aricept alone and Namenda has not yet been filled by her pharmacy for unknown reason.  She is living at home with her husband.  She is mostly independent with activities of daily living but the daughters help out with doing groceries and finances.  There has been no delusions, hallucinations or unsafe behavior.  On Mini-Mental status exam today she scored 26/30 which actually represent improvement from 22/30 from last visit.  She has no new complaints.  Update 11/28/2019 : She returns for follow-up after last visit 6 months ago.  She is accompanied by her daughter.  They both feel she has obtained some improvement after adding Namenda which she is tolerating well without side effects.  She still continues to have short-term memory difficulties.  She does get frustrated quite easily and often shows some anger she does  not get her way.  She often repeats the questions and statements multiple times.  She does complain of feeling dizzy particularly in the mornings when she first gets up and walks quickly.  She does not do any orthostatic tolerance exercises.  She has not had any recurrent stroke or TIA symptoms.  She is tolerating aspirin well without side effects.  She remains on Lipitor 40 mg and is tolerating that well as well.  Blood pressures well controlled at home though it is elevated slightly today in office at 140/76.  She has  not exhibited any unsafe behavior, delusions, hallucinations or violent behavior.  ROS:   14 system review of systems is positive for memory loss, confusion, dizziness, repetition, anger irritability and all other systems negative  PMH:  Past Medical History:  Diagnosis Date  . Anxiety   . Bradycardia    due to PVC resoloved after PVC ablation  . Cystitis   . DJD (degenerative joint disease)   . GERD (gastroesophageal reflux disease)   . Hypertension   . NSVT (nonsustained ventricular tachycardia) ([facility])   . PVC (premature ventricular contraction)   . TIA (transient ischemic attack)     Social History:  Social History   Socioeconomic History  . Marital status: Married    Spouse name: Not on file  . Number of children: Not on file  . Years of education: Not on file  . Highest education level: Not on file  Occupational History  . Not on file  Tobacco Use  . Smoking status: Never Smoker  . Smokeless tobacco: Never Used  Substance and Sexual Activity  . Alcohol use: No  . Drug use: No  . Sexual activity: Not on file  Other Topics Concern  . Not on file  Social History Narrative  . Not on file   Social Determinants of Health   Financial Resource Strain:   . Difficulty of Paying Living Expenses:   Food Insecurity:   . Worried About Charity fundraiser in the Last Year:   . Arboriculturist in the Last Year:   Transportation Needs:   . Film/video editor (Medical):   Marland Kitchen Lack of Transportation (Non-Medical):   Physical Activity:   . Days of Exercise per Week:   . Minutes of Exercise per Session:   Stress:   . Feeling of Stress :   Social Connections:   . Frequency of Communication with Friends and Family:   . Frequency of Social Gatherings with Friends and Family:   . Attends Religious Services:   . Active Member of Clubs or Organizations:   . Attends Archivist Meetings:   Marland Kitchen Marital Status:   Intimate Partner Violence:   . Fear of Current or  Ex-Partner:   . Emotionally Abused:   Marland Kitchen Physically Abused:   . Sexually Abused:     Medications:   Current Outpatient Medications on File Prior to Visit  Medication Sig Dispense Refill  . aspirin EC 81 MG EC tablet Take 1 tablet (81 mg total) by mouth daily. 30 tablet 0  . atorvastatin (LIPITOR) 40 MG tablet TAKE 1 TABLET BY MOUTH EVERY DAY FOR CHOLESTEROL 90 tablet 1  . donepezil (ARICEPT) 10 MG tablet TAKE 1 TABLET BY MOUTH EVERY NIGHT AT BEDTIME OR AS DIRECTED 90 tablet 1  . memantine (NAMENDA) 10 MG tablet Take 1 tablet (10 mg total) by mouth 2 (two) times daily. 180 tablet 1  . Multiple Vitamin (MULTI-VITAMIN PO) Take  by mouth.    . trolamine salicylate (ASPERCREME) 10 % cream Apply 1 application topically as needed (joint pain).    . vitamin C (ASCORBIC ACID) 500 MG tablet Take 500 mg by mouth daily.     No current facility-administered medications on file prior to visit.    Allergies:   Allergies  Allergen Reactions  . Sulfonamide Derivatives     Physical Exam General: well developed, well nourished elderly Caucasian lady, seated, in no evident distress Head: head normocephalic and atraumatic.  Neck: supple with no carotid or supraclavicular bruits Cardiovascular: regular rate and rhythm, no murmurs Musculoskeletal: no deformity Skin:  no rash/petichiae Vascular:  Normal pulses all extremities Vitals:   11/28/19 1254  BP: 140/76  Pulse: 80  Temp: 97.7 F (36.5 C)   Neurologic Exam Mental Status: Awake and fully alert. Oriented to place and time. Recent and remote memory poor. Attention span, concentration and fund of knowledge diminished mood and affect appropriate.  Mini-Mental status exam she scored 26/30 on last visit Mini-Mental status exam not done today.Marland Kitchen  She was able to name only 7 animals which can walk on 4 legs.  Clock drawing 4/4.  Diminished recall 1/3. Cranial Nerves: Fundoscopic exam not done. Pupils equal, briskly reactive to light. Extraocular  movements full without nystagmus. Visual fields full to confrontation. Hearing intact. Facial sensation intact. Face, tongue, palate moves normally and symmetrically.  Motor: Normal bulk and tone. Normal strength in all tested extremity muscles. Sensory.: intact to touch ,pinprick .position and vibratory sensation.  Coordination: Rapid alternating movements normal in all extremities. Finger-to-nose and heel-to-shin performed accurately bilaterally. Gait and Station: Arises from chair without difficulty. Stance is normal. Gait demonstrates normal stride length and balance . Able to heel, toe and tandem walk with moderate difficulty.  Reflexes: 1+ and symmetric. Toes downgoing.       ASSESSMENT: 86 year old lady with right hemispheric TIA from small vessel disease in January 2020 with vascular risk factors of hyperlipidemia and age only.  She also has mild dementia likely of Alzheimer's type and has shown some response to Aricept and Namenda.     PLAN: I had a long d/w patient and her daughter and recommend she continue Aricept 10 mg daily and Namenda 10 mg twice daily for her cognitive impairment and I have encouraged her to increase participation in cognitively challenging activities like solving crossword puzzles, playing bridge and sudoku. We also discussed memory compensation strategies. . She may also consider possible participation in the [place] 2 early dementia trial if interested and will be given information to review at home and decide. Continue aspirin for stroke prevention and maintain strict control of hypertension with blood pressure goal below 130/90, diabetes with hemoglobin A1c goal below 6.5% and lipids with LDL cholesterol goal below 70 mg/dL. I also advised the patient to eat a healthy diet with plenty of whole grains, cereals, fruits and vegetables, exercise regularly and maintain ideal body weight.Return for f/u in 6 months or call earlier if needed... Greater than 50%  of time during this 30 minute visit was spent on counseling,explanation of diagnosis of dementia, planning of further management, discussion with patient and family and coordination of care Antony Contras, MD  [facility] [address] [place], [place] [address]  Phone [phone] Fax [phone] Note: This document was prepared with digital dictation and possible smart phrase technology. Any transcriptional errors that result from this process are unintentional

## 2019-11-29 ENCOUNTER — Other Ambulatory Visit: Payer: Self-pay | Admitting: Neurology

## 2020-01-24 ENCOUNTER — Other Ambulatory Visit: Payer: Self-pay | Admitting: Neurology

## 2020-02-29 ENCOUNTER — Other Ambulatory Visit: Payer: Self-pay | Admitting: Neurology

## 2020-03-10 DIAGNOSIS — Z79899 Other long term (current) drug therapy: Secondary | ICD-10-CM | POA: Diagnosis not present

## 2020-03-10 DIAGNOSIS — I10 Essential (primary) hypertension: Secondary | ICD-10-CM | POA: Diagnosis not present

## 2020-03-10 DIAGNOSIS — E213 Hyperparathyroidism, unspecified: Secondary | ICD-10-CM | POA: Diagnosis not present

## 2020-03-10 DIAGNOSIS — Z0000 Encounter for general adult medical examination without abnormal findings: Secondary | ICD-10-CM | POA: Diagnosis not present

## 2020-03-10 DIAGNOSIS — E7800 Pure hypercholesterolemia, unspecified: Secondary | ICD-10-CM | POA: Diagnosis not present

## 2020-03-10 DIAGNOSIS — G301 Alzheimer's disease with late onset: Secondary | ICD-10-CM | POA: Diagnosis not present

## 2020-03-10 DIAGNOSIS — F0280 Dementia in other diseases classified elsewhere without behavioral disturbance: Secondary | ICD-10-CM | POA: Diagnosis not present

## 2020-03-10 DIAGNOSIS — Z1389 Encounter for screening for other disorder: Secondary | ICD-10-CM | POA: Diagnosis not present

## 2020-04-25 ENCOUNTER — Other Ambulatory Visit: Payer: Self-pay | Admitting: Neurology

## 2020-05-12 DIAGNOSIS — R350 Frequency of micturition: Secondary | ICD-10-CM | POA: Diagnosis not present

## 2020-05-12 DIAGNOSIS — R3915 Urgency of urination: Secondary | ICD-10-CM | POA: Diagnosis not present

## 2020-05-12 DIAGNOSIS — R300 Dysuria: Secondary | ICD-10-CM | POA: Diagnosis not present

## 2020-05-22 ENCOUNTER — Other Ambulatory Visit: Payer: Self-pay | Admitting: Neurology

## 2020-06-10 ENCOUNTER — Ambulatory Visit: Payer: PPO | Admitting: Neurology

## 2020-06-19 DIAGNOSIS — N390 Urinary tract infection, site not specified: Secondary | ICD-10-CM | POA: Diagnosis not present

## 2020-06-19 DIAGNOSIS — N362 Urethral caruncle: Secondary | ICD-10-CM | POA: Diagnosis not present

## 2020-06-19 DIAGNOSIS — N3010 Interstitial cystitis (chronic) without hematuria: Secondary | ICD-10-CM | POA: Diagnosis not present

## 2020-06-19 DIAGNOSIS — N952 Postmenopausal atrophic vaginitis: Secondary | ICD-10-CM | POA: Diagnosis not present

## 2020-07-21 ENCOUNTER — Other Ambulatory Visit: Payer: Self-pay | Admitting: Neurology

## 2020-07-30 DIAGNOSIS — H04123 Dry eye syndrome of bilateral lacrimal glands: Secondary | ICD-10-CM | POA: Diagnosis not present

## 2020-07-30 DIAGNOSIS — H524 Presbyopia: Secondary | ICD-10-CM | POA: Diagnosis not present

## 2020-07-30 DIAGNOSIS — H5211 Myopia, right eye: Secondary | ICD-10-CM | POA: Diagnosis not present

## 2020-07-30 DIAGNOSIS — H52222 Regular astigmatism, left eye: Secondary | ICD-10-CM | POA: Diagnosis not present

## 2020-07-30 DIAGNOSIS — H26493 Other secondary cataract, bilateral: Secondary | ICD-10-CM | POA: Diagnosis not present

## 2020-10-08 ENCOUNTER — Ambulatory Visit
Admission: RE | Admit: 2020-10-08 | Discharge: 2020-10-08 | Disposition: A | Discharge status: Home / Self Care | Payer: PPO | Source: Ambulatory Visit | Attending: Geriatric Medicine | Admitting: Geriatric Medicine

## 2020-10-08 ENCOUNTER — Other Ambulatory Visit: Payer: Self-pay | Admitting: Geriatric Medicine

## 2020-10-08 DIAGNOSIS — F0280 Dementia in other diseases classified elsewhere without behavioral disturbance: Secondary | ICD-10-CM | POA: Diagnosis not present

## 2020-10-08 DIAGNOSIS — M545 Low back pain, unspecified: Secondary | ICD-10-CM

## 2020-10-08 DIAGNOSIS — E213 Hyperparathyroidism, unspecified: Secondary | ICD-10-CM | POA: Diagnosis not present

## 2020-10-08 DIAGNOSIS — G301 Alzheimer's disease with late onset: Secondary | ICD-10-CM | POA: Diagnosis not present

## 2020-10-08 DIAGNOSIS — I10 Essential (primary) hypertension: Secondary | ICD-10-CM | POA: Diagnosis not present

## 2020-10-21 ENCOUNTER — Other Ambulatory Visit: Payer: Self-pay | Admitting: Neurology

## 2020-10-29 ENCOUNTER — Other Ambulatory Visit: Payer: Self-pay | Admitting: Geriatric Medicine

## 2020-10-29 DIAGNOSIS — I10 Essential (primary) hypertension: Secondary | ICD-10-CM | POA: Diagnosis not present

## 2020-10-29 DIAGNOSIS — S32010D Wedge compression fracture of first lumbar vertebra, subsequent encounter for fracture with routine healing: Secondary | ICD-10-CM | POA: Diagnosis not present

## 2020-11-20 ENCOUNTER — Other Ambulatory Visit: Payer: Self-pay | Admitting: Neurology

## 2020-11-20 ENCOUNTER — Other Ambulatory Visit: Payer: Self-pay | Admitting: Emergency Medicine

## 2020-11-20 ENCOUNTER — Telehealth: Payer: Self-pay | Admitting: Emergency Medicine

## 2020-11-20 MED ORDER — MEMANTINE HCL 10 MG PO TABS: 10.0000 mg | ORAL_TABLET | Freq: Two times a day (BID) | ORAL | 0 refills | Status: DC

## 2020-11-20 NOTE — Telephone Encounter (Signed)
Patient scheduled for follow up.

## 2020-12-03 DIAGNOSIS — M810 Age-related osteoporosis without current pathological fracture: Secondary | ICD-10-CM | POA: Diagnosis not present

## 2020-12-03 DIAGNOSIS — Z780 Asymptomatic menopausal state: Secondary | ICD-10-CM | POA: Diagnosis not present

## 2020-12-03 DIAGNOSIS — M8589 Other specified disorders of bone density and structure, multiple sites: Secondary | ICD-10-CM | POA: Diagnosis not present

## 2020-12-24 ENCOUNTER — Encounter: Payer: Self-pay | Admitting: Neurology

## 2020-12-24 ENCOUNTER — Ambulatory Visit: Payer: Medicare HMO | Admitting: Neurology

## 2020-12-24 VITALS — BP 113/76 | HR 72 | Ht 64.0 in | Wt 138.8 lb

## 2020-12-24 DIAGNOSIS — R413 Other amnesia: Secondary | ICD-10-CM

## 2020-12-24 DIAGNOSIS — G301 Alzheimer's disease with late onset: Secondary | ICD-10-CM | POA: Diagnosis not present

## 2020-12-24 DIAGNOSIS — F0280 Dementia in other diseases classified elsewhere without behavioral disturbance: Secondary | ICD-10-CM | POA: Diagnosis not present

## 2020-12-24 DIAGNOSIS — I6990 Unspecified sequelae of unspecified cerebrovascular disease: Secondary | ICD-10-CM | POA: Diagnosis not present

## 2020-12-24 NOTE — Patient Instructions (Signed)
I had a long d/w patient and her daughter and recommend she continue Aricept 10 mg daily and Namenda 10 mg twice daily for her cognitive impairment and I have encouraged her to increase participation in cognitively challenging activities like solving crossword puzzles, playing bridge and sudoku. We also discussed memory compensation strategies.. Continue aspirin for stroke prevention and maintain strict control of hypertension with blood pressure goal below 130/90, diabetes with hemoglobin A1c goal below 6.5% and lipids with LDL cholesterol goal below 70 mg/dL. I also advised the patient to eat a healthy diet with plenty of whole grains, cereals, fruits and vegetables, exercise regularly and maintain ideal body weight.Return for f/u in 6 months  or call earlier if needed.Marland KitchenMarland Kitchen

## 2020-12-24 NOTE — Progress Notes (Signed)
[facility] [address] [place]. [place] [address] [phone]       OFFICE FOLLOW-UP NOTE  Crystal Chang Date of Birth:  February 12, 1930 Medical Record Number:  [id number]   HPI: Initial Virtual video visit 01/24/19 : Crystal Chang is seen today for virtual video follow-up visit after initial consultation visit on 11/01/2018.  She states that she has been able to tolerate increase Aricept dose of 10 mg daily without any GI side effects or dizziness.  Her husband in fact noted some initial improvement in her memory and cognition.  The patient herself feels she is noted no difference.  She still mostly independent in most activities of daily living.  She denies any delusions, hallucinations, agitation or unsafe behavior.  She has not been doing any activities which are cognitively challenging on a regular basis.  She remains on aspirin which is tolerating well without any significant bruising or bleeding.  Her blood pressure remains under good control.  She did have memory panel labs done at last visit all of which were normal.  EEG was ordered but has not yet been scheduled due to the coronavirus pandemic.  She has no new complaints. Update 04/05/2019 : Crystal Chang returns for follow-up today after initial video visit 2 months ago.  She is accompanied by her daughter.  She is tolerating Aricept 10 mg daily without significant GI or CNS side effects.  However she continues to have short-term memory difficulties which is not yet good.  She does get irritable and upset easily but there has been no agitation or violent behavior.  She does not have any delusions hallucinations.  She can walk fairly well has not fallen or hurt herself.  She has never gotten lost.  She did undergo EEG on 03/07/2019 which was normal.  She had lab work for reversible causes of memory loss on 11/01/2018 previously which were also all unremarkable.  Patient is living at home with her husband who is mostly doing most cooking  and cleaning.  The patient is independent for her self-care needs and can dress and bathe herself and wear her clothes.  She is not doing any bills.  She does not go out of the house a lot.  She is not driving.  On Mini-Mental status exam testing today she scored 22/30 with deficits in orientation recall , poor animal naming and copying figures.   Update 06/13/2019 : She returns for follow-up after last visit with me 2 months ago.  She feels she is about the same.  Daughter states she continues to have short-term memory difficulties but is otherwise doing well.  She is still on only on Aricept alone and Namenda has not yet been filled by her pharmacy for unknown reason.  She is living at home with her husband.  She is mostly independent with activities of daily living but the daughters help out with doing groceries and finances.  There has been no delusions, hallucinations or unsafe behavior.  On Mini-Mental status exam today she scored 26/30 which actually represent improvement from 22/30 from last visit.  She has no new complaints.  Update 11/28/2019 : She returns for follow-up after last visit 6 months ago.  She is accompanied by her daughter.  They both feel she has obtained some improvement after adding Namenda which she is tolerating well without side effects.  She still continues to have short-term memory difficulties.  She does get frustrated quite easily and often shows some anger she does  not get her way.  She often repeats the questions and statements multiple times.  She does complain of feeling dizzy particularly in the mornings when she first gets up and walks quickly.  She does not do any orthostatic tolerance exercises.  She has not had any recurrent stroke or TIA symptoms.  She is tolerating aspirin well without side effects.  She remains on Lipitor 40 mg and is tolerating that well as well.  Blood pressures well controlled at home though it is elevated slightly today in office at 140/76.  She has  not exhibited any unsafe behavior, delusions, hallucinations or violent behavior. Update 12/25/2022 : She returns for follow-up after last visit a year ago.  She is accompanied by her daughter.  Patient continues to do well from neurovascular standpoint and has not had recurrent TIA or stroke symptoms.  She remains on aspirin which is tolerating well without bruising or bleeding.  Blood pressure is well controlled and today it is 113/76.  She is tolerating Lipitor without muscle aches and pains.  She continues to have short-term memory difficulties and has good days and bad days.  Overall the family feels she is about the same.  She is mostly independent in most activities of daily living.  She lives at home with her husband does most of the cooking.  She has no new complaints.  She is remains on Aricept 10 mg daily and Namenda 10 mg twice daily and is tolerating both medications well without any side effects.  She has no complaints today.  ROS:   14 system review of systems is positive for memory loss, confusion, dizziness, repetition, anger irritability and all other systems negative  PMH:  Past Medical History:  Diagnosis Date  . Anxiety   . Bradycardia    due to PVC resoloved after PVC ablation  . Cystitis   . DJD (degenerative joint disease)   . GERD (gastroesophageal reflux disease)   . Hypertension   . NSVT (nonsustained ventricular tachycardia) ([place])   . PVC (premature ventricular contraction)   . TIA (transient ischemic attack)     Social History:  Social History   Socioeconomic History  . Marital status: Married    Spouse name: Not on file  . Number of children: Not on file  . Years of education: Not on file  . Highest education level: Not on file  Occupational History  . Not on file  Tobacco Use  . Smoking status: Never Smoker  . Smokeless tobacco: Never Used  Substance and Sexual Activity  . Alcohol use: No  . Drug use: No  . Sexual activity: Not on file  Other Topics  Concern  . Not on file  Social History Narrative   Lives with husband   Right Handed   Drinks no caffeine.   Social Determinants of Health   Financial Resource Strain: Not on file  Food Insecurity: Not on file  Transportation Needs: Not on file  Physical Activity: Not on file  Stress: Not on file  Social Connections: Not on file  Intimate Partner Violence: Not on file    Medications:   Current Outpatient Medications on File Prior to Visit  Medication Sig Dispense Refill  . acetaminophen (TYLENOL) 500 MG tablet Take 500 mg by mouth every 6 (six) hours as needed.    Marland Kitchen aspirin EC 81 MG EC tablet Take 1 tablet (81 mg total) by mouth daily. 30 tablet 0  . atorvastatin (LIPITOR) 40 MG tablet TAKE 1 TABLET  BY MOUTH EVERY DAY FOR CHOLESTEROL 90 tablet 0  . CALCIUM/MAGNESIUM/ZINC FORMULA PO Take by mouth.    . cetirizine (ZYRTEC) 10 MG tablet Take 10 mg by mouth daily.    Marland Kitchen donepezil (ARICEPT) 10 MG tablet TAKE 1 TABLET BY MOUTH EVERY NIGHT AT BEDTIME OR AS DIRECTED 90 tablet 1  . estradiol (ESTRACE) 0.1 MG/GM vaginal cream Place 1 Applicatorful vaginally at bedtime.    . lidocaine (XYLOCAINE) 2 % jelly 1 application as needed.    . memantine (NAMENDA) 10 MG tablet Take 1 tablet (10 mg total) by mouth 2 (two) times daily. 180 tablet 0  . Meth-Hyo-M Bl-Na Phos-Ph Sal (URO-MP) 118 MG CAPS Take by mouth.    . Multiple Vitamin (MULTI-VITAMIN PO) Take by mouth.    . trolamine salicylate (ASPERCREME) 10 % cream Apply 1 application topically as needed (joint pain).    . vitamin C (ASCORBIC ACID) 500 MG tablet Take 500 mg by mouth daily.     No current facility-administered medications on file prior to visit.    Allergies:   Allergies  Allergen Reactions  . Sulfonamide Derivatives     Physical Exam General: frail elderly Caucasian lady, seated, in no evident distress Head: head normocephalic and atraumatic.  Neck: supple with no carotid or supraclavicular bruits Cardiovascular: regular  rate and rhythm, no murmurs Musculoskeletal: no deformity Skin:  no rash/petichiae Vascular:  Normal pulses all extremities Vitals:   12/24/20 1517  BP: 113/76  Pulse: 72   Neurologic Exam Mental Status: Awake and fully alert. Oriented to place and time. Recent and remote memory poor. Attention span, concentration and fund of knowledge diminished mood and affect appropriate.  Mini-Mental status exam not done ( last visit she scored 26/30  .  She was able to name only 3 animals which can walk on 4 legs.  Clock drawing 3/4.  Diminished recall 1/3. Cranial Nerves: Fundoscopic exam not done. Pupils equal, briskly reactive to light. Extraocular movements full without nystagmus. Visual fields full to confrontation. Hearing intact. Facial sensation intact. Face, tongue, palate moves normally and symmetrically.  Motor: Normal bulk and tone. Normal strength in all tested extremity muscles. Sensory.: intact to touch ,pinprick .position and vibratory sensation.  Coordination: Rapid alternating movements normal in all extremities. Finger-to-nose and heel-to-shin performed accurately bilaterally. Gait and Station: Arises from chair without difficulty. Stance is normal. Gait demonstrates normal stride length and balance . Able to heel, toe and tandem walk with moderate difficulty. Unable to do tandem walking. Reflexes: 1+ and symmetric. Toes downgoing.       ASSESSMENT: 87 year old lady with right hemispheric TIA from small vessel disease in January 2020 with vascular risk factors of hyperlipidemia and age only.  She also has mild dementia likely of Alzheimer's type which seems stable on Aricept and Namenda.     PLAN: I had a long d/w patient and her daughter and recommend she continue Aricept 10 mg daily and Namenda 10 mg twice daily for her cognitive impairment and I have encouraged her to increase participation in cognitively challenging activities like solving crossword puzzles, playing bridge and  sudoku. We also discussed memory compensation strategies.. Continue aspirin for stroke prevention and maintain strict control of hypertension with blood pressure goal below 130/90, diabetes with hemoglobin A1c goal below 6.5% and lipids with LDL cholesterol goal below 70 mg/dL. I also advised the patient to eat a healthy diet with plenty of whole grains, cereals, fruits and vegetables, exercise regularly and maintain ideal body weight.Return for  f/u in 6 months  or call earlier if needed... Greater than 50% of time during this 25 minute visit was spent on counseling,explanation of diagnosis of dementia, planning of further management, discussion with patient and family and coordination of care Antony Contras, MD  [facility] [address] [place], [place] [address]  Phone [phone] Fax [phone] Note: This document was prepared with digital dictation and possible smart phrase technology. Any transcriptional errors that result from this process are unintentional

## 2021-01-14 DIAGNOSIS — G301 Alzheimer's disease with late onset: Secondary | ICD-10-CM | POA: Diagnosis not present

## 2021-01-14 DIAGNOSIS — M545 Low back pain, unspecified: Secondary | ICD-10-CM | POA: Diagnosis not present

## 2021-01-14 DIAGNOSIS — F0280 Dementia in other diseases classified elsewhere without behavioral disturbance: Secondary | ICD-10-CM | POA: Diagnosis not present

## 2021-01-14 DIAGNOSIS — I10 Essential (primary) hypertension: Secondary | ICD-10-CM | POA: Diagnosis not present

## 2021-01-14 DIAGNOSIS — R110 Nausea: Secondary | ICD-10-CM | POA: Diagnosis not present

## 2021-02-05 ENCOUNTER — Telehealth: Payer: Self-pay | Admitting: Neurology

## 2021-02-05 ENCOUNTER — Other Ambulatory Visit: Payer: Self-pay | Admitting: Neurology

## 2021-02-05 MED ORDER — TRAZODONE HCL 50 MG PO TABS: 50.0000 mg | ORAL_TABLET | Freq: Every day | ORAL | 1 refills | Status: DC

## 2021-02-05 NOTE — Telephone Encounter (Signed)
Returned patient's call.  Prescription has been called in to pharmacy and should be available for pick up.  Patient denied further questions, verbalized understanding and expressed appreciation for the phone call.

## 2021-02-05 NOTE — Telephone Encounter (Signed)
Pt's daughter(on DPR) has called to report pt has become very combative (exa: cursing at family and those that come to help her, threatening to kill family & help, throwing things, hitting) daughter states when pt has calmed down she does not recall the concerning behavior.  Daughter is asking if something can be called in to help pt sleep and to her calm her down.  Please call.

## 2021-02-11 ENCOUNTER — Other Ambulatory Visit: Payer: Self-pay | Admitting: Neurology

## 2021-03-12 DIAGNOSIS — E7800 Pure hypercholesterolemia, unspecified: Secondary | ICD-10-CM | POA: Diagnosis not present

## 2021-03-12 DIAGNOSIS — Z79899 Other long term (current) drug therapy: Secondary | ICD-10-CM | POA: Diagnosis not present

## 2021-03-12 DIAGNOSIS — Z0000 Encounter for general adult medical examination without abnormal findings: Secondary | ICD-10-CM | POA: Diagnosis not present

## 2021-03-12 DIAGNOSIS — I10 Essential (primary) hypertension: Secondary | ICD-10-CM | POA: Diagnosis not present

## 2021-03-12 DIAGNOSIS — G301 Alzheimer's disease with late onset: Secondary | ICD-10-CM | POA: Diagnosis not present

## 2021-03-12 DIAGNOSIS — F0280 Dementia in other diseases classified elsewhere without behavioral disturbance: Secondary | ICD-10-CM | POA: Diagnosis not present

## 2021-03-12 DIAGNOSIS — E213 Hyperparathyroidism, unspecified: Secondary | ICD-10-CM | POA: Diagnosis not present

## 2021-03-19 ENCOUNTER — Other Ambulatory Visit: Payer: Self-pay | Admitting: Neurology

## 2021-04-17 DIAGNOSIS — M791 Myalgia, unspecified site: Secondary | ICD-10-CM | POA: Diagnosis not present

## 2021-04-17 DIAGNOSIS — I10 Essential (primary) hypertension: Secondary | ICD-10-CM | POA: Diagnosis not present

## 2021-04-17 DIAGNOSIS — E7800 Pure hypercholesterolemia, unspecified: Secondary | ICD-10-CM | POA: Diagnosis not present

## 2021-04-29 DIAGNOSIS — E7800 Pure hypercholesterolemia, unspecified: Secondary | ICD-10-CM | POA: Diagnosis not present

## 2021-04-29 DIAGNOSIS — I10 Essential (primary) hypertension: Secondary | ICD-10-CM | POA: Diagnosis not present

## 2021-04-29 DIAGNOSIS — G301 Alzheimer's disease with late onset: Secondary | ICD-10-CM | POA: Diagnosis not present

## 2021-04-29 DIAGNOSIS — M791 Myalgia, unspecified site: Secondary | ICD-10-CM | POA: Diagnosis not present

## 2021-04-29 DIAGNOSIS — R300 Dysuria: Secondary | ICD-10-CM | POA: Diagnosis not present

## 2021-05-14 ENCOUNTER — Other Ambulatory Visit: Payer: Self-pay | Admitting: Neurology

## 2021-06-18 ENCOUNTER — Other Ambulatory Visit: Payer: Self-pay | Admitting: Neurology

## 2021-06-29 ENCOUNTER — Ambulatory Visit: Payer: Medicare HMO | Admitting: Adult Health

## 2021-07-13 ENCOUNTER — Other Ambulatory Visit: Payer: Self-pay | Admitting: Neurology

## 2021-08-13 ENCOUNTER — Other Ambulatory Visit: Payer: Self-pay | Admitting: Neurology

## 2021-09-08 ENCOUNTER — Other Ambulatory Visit: Payer: Self-pay

## 2021-09-08 ENCOUNTER — Ambulatory Visit: Payer: Medicare HMO | Admitting: Adult Health

## 2021-09-08 ENCOUNTER — Encounter: Payer: Self-pay | Admitting: Adult Health

## 2021-09-08 VITALS — BP 138/72 | HR 60 | Ht 63.0 in | Wt 156.0 lb

## 2021-09-08 DIAGNOSIS — G459 Transient cerebral ischemic attack, unspecified: Secondary | ICD-10-CM

## 2021-09-08 DIAGNOSIS — F0280 Dementia in other diseases classified elsewhere without behavioral disturbance: Secondary | ICD-10-CM

## 2021-09-08 DIAGNOSIS — G301 Alzheimer's disease with late onset: Secondary | ICD-10-CM | POA: Diagnosis not present

## 2021-09-08 NOTE — Progress Notes (Signed)
[facility] [address] [place]. [place] [address] [phone]       OFFICE FOLLOW-UP NOTE  Ms. RYLA CAUTHON Date of Birth:  07/30/1930 Medical Record Number:  [id number]   HPI: Initial Virtual video visit 01/24/19 : Ms. Burnett is seen today for virtual video follow-up visit after initial consultation visit on 11/01/2018.  She states that she has been able to tolerate increase Aricept dose of 10 mg daily without any GI side effects or dizziness.  Her husband in fact noted some initial improvement in her memory and cognition.  The patient herself feels she is noted no difference.  She still mostly independent in most activities of daily living.  She denies any delusions, hallucinations, agitation or unsafe behavior.  She has not been doing any activities which are cognitively challenging on a regular basis.  She remains on aspirin which is tolerating well without any significant bruising or bleeding.  Her blood pressure remains under good control.  She did have memory panel labs done at last visit all of which were normal.  EEG was ordered but has not yet been scheduled due to the coronavirus pandemic.  She has no new complaints. Update 04/05/2019 : Ms. Azer returns for follow-up today after initial video visit 2 months ago.  She is accompanied by her daughter.  She is tolerating Aricept 10 mg daily without significant GI or CNS side effects.  However she continues to have short-term memory difficulties which is not yet good.  She does get irritable and upset easily but there has been no agitation or violent behavior.  She does not have any delusions hallucinations.  She can walk fairly well has not fallen or hurt herself.  She has never gotten lost.  She did undergo EEG on 03/07/2019 which was normal.  She had lab work for reversible causes of memory loss on 11/01/2018 previously which were also all unremarkable.  Patient is living at home with her husband who is mostly doing most cooking  and cleaning.  The patient is independent for her self-care needs and can dress and bathe herself and wear her clothes.  She is not doing any bills.  She does not go out of the house a lot.  She is not driving.  On Mini-Mental status exam testing today she scored 22/30 with deficits in orientation recall , poor animal naming and copying figures.   Update 06/13/2019 : She returns for follow-up after last visit with me 2 months ago.  She feels she is about the same.  Daughter states she continues to have short-term memory difficulties but is otherwise doing well.  She is still on only on Aricept alone and Namenda has not yet been filled by her pharmacy for unknown reason.  She is living at home with her husband.  She is mostly independent with activities of daily living but the daughters help out with doing groceries and finances.  There has been no delusions, hallucinations or unsafe behavior.  On Mini-Mental status exam today she scored 26/30 which actually represent improvement from 22/30 from last visit.  She has no new complaints.  Update 11/28/2019 : She returns for follow-up after last visit 6 months ago.  She is accompanied by her daughter.  They both feel she has obtained some improvement after adding Namenda which she is tolerating well without side effects.  She still continues to have short-term memory difficulties.  She does get frustrated quite easily and often shows some anger she does  not get her way.  She often repeats the questions and statements multiple times.  She does complain of feeling dizzy particularly in the mornings when she first gets up and walks quickly.  She does not do any orthostatic tolerance exercises.  She has not had any recurrent stroke or TIA symptoms.  She is tolerating aspirin well without side effects.  She remains on Lipitor 40 mg and is tolerating that well as well.  Blood pressures well controlled at home though it is elevated slightly today in office at 140/76.  She has  not exhibited any unsafe behavior, delusions, hallucinations or violent behavior. Update 12/25/2022 : She returns for follow-up after last visit a year ago.  She is accompanied by her daughter.  Patient continues to do well from neurovascular standpoint and has not had recurrent TIA or stroke symptoms.  She remains on aspirin which is tolerating well without bruising or bleeding.  Blood pressure is well controlled and today it is 113/76.  She is tolerating Lipitor without muscle aches and pains.  She continues to have short-term memory difficulties and has good days and bad days.  Overall the family feels she is about the same.  She is mostly independent in most activities of daily living.  She lives at home with her husband does most of the cooking.  She has no new complaints.  She is remains on Aricept 10 mg daily and Namenda 10 mg twice daily and is tolerating both medications well without any side effects.  She has no complaints today.   Update 09/08/2020 JM: returns for 86-month stroke and memory follow-up accompanied by daughter.  Reports cognition has gradually declined.  Did have 1 instance of confusing her daughter for her sister but otherwise no hallucinations, paranoia or delusions.  Sleeps well -on trazodone managed by PCP. Appetite okay - could be better per daughter.  Able to do ADLs independently. Does assist some around the home but reports her husband does majority of IADLs as he will not let her help.  Remains on Aricept and Namenda without side effects. MMSE today 13/30.  No new or reoccurring stroke/TIA symptoms.  Compliant on aspirin and atorvastatin.  Blood pressure today 138/72.  Routinely followed by Dr. Felipa Eth. She questions if ongoing follow-up visits are needed.  No new concerns at this time.       ROS:   14 system review of systems is positive for those listed in HPI and all other systems negative  PMH:  Past Medical History:  Diagnosis Date   Anxiety    Bradycardia     due to PVC resoloved after PVC ablation   Cystitis    DJD (degenerative joint disease)    GERD (gastroesophageal reflux disease)    Hypertension    NSVT (nonsustained ventricular tachycardia)    PVC (premature ventricular contraction)    TIA (transient ischemic attack)     Social History:  Social History   Socioeconomic History   Marital status: Married    Spouse name: Not on file   Number of children: Not on file   Years of education: Not on file   Highest education level: Not on file  Occupational History   Not on file  Tobacco Use   Smoking status: Never   Smokeless tobacco: Never  Substance and Sexual Activity   Alcohol use: No   Drug use: No   Sexual activity: Not on file  Other Topics Concern   Not on file  Social History Narrative  Lives with husband   Right Handed   Drinks no caffeine.   Social Determinants of Health   Financial Resource Strain: Not on file  Food Insecurity: Not on file  Transportation Needs: Not on file  Physical Activity: Not on file  Stress: Not on file  Social Connections: Not on file  Intimate Partner Violence: Not on file    Medications:   Current Outpatient Medications on File Prior to Visit  Medication Sig Dispense Refill   acetaminophen (TYLENOL) 500 MG tablet Take 500 mg by mouth every 6 (six) hours as needed.     aspirin EC 81 MG EC tablet Take 1 tablet (81 mg total) by mouth daily. 30 tablet 0   atorvastatin (LIPITOR) 40 MG tablet TAKE 1 TABLET BY MOUTH EVERY DAY FOR CHOLESTEROL 90 tablet 0   CALCIUM/MAGNESIUM/ZINC FORMULA PO Take by mouth.     cetirizine (ZYRTEC) 10 MG tablet Take 10 mg by mouth daily.     donepezil (ARICEPT) 10 MG tablet TAKE 1 TABLET BY MOUTH EVERY NIGHT AT BEDTIME OR AS DIRECTED 90 tablet 1   estradiol (ESTRACE) 0.1 MG/GM vaginal cream Place 1 Applicatorful vaginally at bedtime.     lidocaine (XYLOCAINE) 2 % jelly 1 application as needed.     memantine (NAMENDA) 10 MG tablet TAKE 1 TABLET BY MOUTH 2  TIMES A DAY 180 tablet 0   Meth-Hyo-M Bl-Na Phos-Ph Sal (URO-MP) 118 MG CAPS Take by mouth.     Multiple Vitamin (MULTI-VITAMIN PO) Take by mouth.     traMADol (ULTRAM) 50 MG tablet Take 50 mg by mouth 2 (two) times daily as needed.     traZODone (DESYREL) 50 MG tablet TAKE 1 TABLET BY MOUTH AT BEDTIME 30 tablet 0   trolamine salicylate (ASPERCREME) 10 % cream Apply 1 application topically as needed (joint pain).     vitamin C (ASCORBIC ACID) 500 MG tablet Take 500 mg by mouth daily.     No current facility-administered medications on file prior to visit.    Allergies:   Allergies  Allergen Reactions   Sulfonamide Derivatives     Physical Exam Today's Vitals   09/08/21 1100  BP: 138/72  Pulse: 60  Weight: 156 lb (70.8 kg)  Height: 5\' 3"  (1.6 m)   Body mass index is 27.63 kg/m.  General: frail very pleasant elderly Caucasian lady, seated, in no evident distress Head: head normocephalic and atraumatic.  Neck: supple with no carotid or supraclavicular bruits Cardiovascular: regular rate and rhythm, no murmurs Musculoskeletal: no deformity Skin:  no rash/petichiae Vascular:  Normal pulses all extremities  Neurologic Exam Mental Status: Awake and fully alert. Oriented to place and time. Recent and remote memory poor. Attention span, concentration and fund of knowledge diminished.  Mood and affect appropriate.   MMSE - Mini Mental State Exam 09/08/2021 06/13/2019 04/05/2019  Orientation to time 1 2 5   Orientation to Place 2 5 3   Registration 3 3 3   Attention/ Calculation 0 5 0  Recall 0 2 2  Language- name 2 objects 2 2 2   Language- repeat 0 1 1  Language- follow 3 step command 3 3 3   Language- read & follow direction 1 1 1   Write a sentence 1 1 1   Copy design 0 1 1  Total score 13 26 22    Cranial Nerves: Pupils equal, briskly reactive to light. Extraocular movements full without nystagmus. Visual fields full to confrontation. Hearing intact. Facial sensation intact. Face,  tongue, palate moves normally and  symmetrically.  Motor: Normal bulk and tone. Normal strength in all tested extremity muscles. Sensory.: intact to touch ,pinprick .position and vibratory sensation.  Coordination: Rapid alternating movements normal in all extremities. Finger-to-nose and heel-to-shin performed accurately bilaterally. Gait and Station: Arises from chair without difficulty. Stance is normal. Gait demonstrates normal stride length and balance . Able to heel, toe and tandem walk with moderate difficulty. Unable to do tandem walking. Reflexes: 1+ and symmetric. Toes downgoing.       ASSESSMENT/PLAN: 88 year old lady with right hemispheric TIA from small vessel disease in January 2020 with vascular risk factors of hyperlipidemia and age only.  She also has moderate dementia likely of Alzheimer's type.    TIA Continue aspirin and atorvastatin for secondary stroke prevention measures Close follow-up with PCP for aggressive stroke risk factor management including HLD with LDL goal<70 Alzheimer's dementia MMSE 13/30 (prior 26/30 in 05/2019) Gradual decline per daughter with progression noted since prior MMSE Continue Aricept and Namenda No significant behavioral concerns   Per patient request, can continue to follow with PCP for dementia monitoring and management as well as stroke risk factor management and follow up here as needed   CC:  [facility] provider: Lajean Manes, MD   I spent 32 minutes of face-to-face and non-face-to-face time with patient and daughter.  This included previsit chart review, lab review, study review, electronic health record documentation, patient and daughter education and discussion regarding Alzheimer's dementia with completion and review of MMSE, continued medication usage, typical progression of dementia, history of TIA with secondary stroke prevention measures and aggressive stroke risk factor management and answered all other questions to patient and  daughters satisfaction  Frann Rider, [facility]  [facility] [address] [place], [place] [address]  Phone [phone] Fax [phone] Note: This document was prepared with digital dictation and possible smart phrase technology. Any transcriptional errors that result from this process are unintentional.

## 2021-09-08 NOTE — Patient Instructions (Signed)
Continue aricept and namenda - request ongoing management by PCP  Continue aspirin 81 mg daily  and atorvastatin 40mg  daily  for secondary stroke prevention  Continue to follow up with PCP regarding cholesterol and blood pressure management  Maintain strict control of hypertension with blood pressure goal below 130/90 and cholesterol with LDL cholesterol (bad cholesterol) goal below 70 mg/dL.   Signs of a Stroke? Follow the BEFAST method:  Balance Watch for a sudden loss of balance, trouble with coordination or vertigo Eyes Is there a sudden loss of vision in one or both eyes? Or double vision?  Face: Ask the person to smile. Does one side of the face droop or is it numb?  Arms: Ask the person to raise both arms. Does one arm drift downward? Is there weakness or numbness of a leg? Speech: Ask the person to repeat a simple phrase. Does the speech sound slurred/strange? Is the person confused ? Time: If you observe any of these signs, call 911.        Thank you for coming to see [place] at [facility]. I hope we have been able to provide you high quality care today.  You may receive a patient satisfaction survey over the next few weeks. We would appreciate your feedback and comments so that we may continue to improve ourselves and the health of our patients.

## 2021-09-10 ENCOUNTER — Other Ambulatory Visit: Payer: Self-pay | Admitting: Neurology

## 2021-09-14 DIAGNOSIS — G301 Alzheimer's disease with late onset: Secondary | ICD-10-CM | POA: Diagnosis not present

## 2021-09-14 DIAGNOSIS — F0280 Dementia in other diseases classified elsewhere without behavioral disturbance: Secondary | ICD-10-CM | POA: Diagnosis not present

## 2021-09-14 DIAGNOSIS — E213 Hyperparathyroidism, unspecified: Secondary | ICD-10-CM | POA: Diagnosis not present

## 2021-09-14 DIAGNOSIS — I10 Essential (primary) hypertension: Secondary | ICD-10-CM | POA: Diagnosis not present

## 2021-10-15 ENCOUNTER — Other Ambulatory Visit: Payer: Self-pay | Admitting: Neurology

## 2021-10-21 DIAGNOSIS — R197 Diarrhea, unspecified: Secondary | ICD-10-CM | POA: Diagnosis not present

## 2021-10-21 DIAGNOSIS — R42 Dizziness and giddiness: Secondary | ICD-10-CM | POA: Diagnosis not present

## 2021-11-12 ENCOUNTER — Other Ambulatory Visit: Payer: Self-pay | Admitting: Neurology

## 2022-04-07 DIAGNOSIS — I10 Essential (primary) hypertension: Secondary | ICD-10-CM | POA: Diagnosis not present

## 2022-04-07 DIAGNOSIS — E213 Hyperparathyroidism, unspecified: Secondary | ICD-10-CM | POA: Diagnosis not present

## 2022-04-07 DIAGNOSIS — G301 Alzheimer's disease with late onset: Secondary | ICD-10-CM | POA: Diagnosis not present

## 2022-04-07 DIAGNOSIS — Z1331 Encounter for screening for depression: Secondary | ICD-10-CM | POA: Diagnosis not present

## 2022-04-07 DIAGNOSIS — Z0000 Encounter for general adult medical examination without abnormal findings: Secondary | ICD-10-CM | POA: Diagnosis not present

## 2022-04-07 DIAGNOSIS — F0280 Dementia in other diseases classified elsewhere without behavioral disturbance: Secondary | ICD-10-CM | POA: Diagnosis not present

## 2022-04-07 DIAGNOSIS — E7800 Pure hypercholesterolemia, unspecified: Secondary | ICD-10-CM | POA: Diagnosis not present

## 2022-04-07 DIAGNOSIS — Z79899 Other long term (current) drug therapy: Secondary | ICD-10-CM | POA: Diagnosis not present

## 2022-04-07 DIAGNOSIS — Z7189 Other specified counseling: Secondary | ICD-10-CM | POA: Diagnosis not present

## 2022-04-07 DIAGNOSIS — K219 Gastro-esophageal reflux disease without esophagitis: Secondary | ICD-10-CM | POA: Diagnosis not present

## 2023-05-02 DIAGNOSIS — Z0000 Encounter for general adult medical examination without abnormal findings: Secondary | ICD-10-CM | POA: Diagnosis not present

## 2023-05-02 DIAGNOSIS — F0280 Dementia in other diseases classified elsewhere without behavioral disturbance: Secondary | ICD-10-CM | POA: Diagnosis not present

## 2023-05-02 DIAGNOSIS — Z79899 Other long term (current) drug therapy: Secondary | ICD-10-CM | POA: Diagnosis not present

## 2023-05-02 DIAGNOSIS — E213 Hyperparathyroidism, unspecified: Secondary | ICD-10-CM | POA: Diagnosis not present

## 2023-05-02 DIAGNOSIS — G301 Alzheimer's disease with late onset: Secondary | ICD-10-CM | POA: Diagnosis not present

## 2023-05-02 DIAGNOSIS — K219 Gastro-esophageal reflux disease without esophagitis: Secondary | ICD-10-CM | POA: Diagnosis not present

## 2023-05-02 DIAGNOSIS — Z1331 Encounter for screening for depression: Secondary | ICD-10-CM | POA: Diagnosis not present

## 2023-05-02 DIAGNOSIS — I10 Essential (primary) hypertension: Secondary | ICD-10-CM | POA: Diagnosis not present

## 2023-05-02 DIAGNOSIS — Z23 Encounter for immunization: Secondary | ICD-10-CM | POA: Diagnosis not present

## 2023-05-02 DIAGNOSIS — Z9181 History of falling: Secondary | ICD-10-CM | POA: Diagnosis not present

## 2023-05-02 DIAGNOSIS — E7800 Pure hypercholesterolemia, unspecified: Secondary | ICD-10-CM | POA: Diagnosis not present

## 2023-11-15 DIAGNOSIS — F02C0 Dementia in other diseases classified elsewhere, severe, without behavioral disturbance, psychotic disturbance, mood disturbance, and anxiety: Secondary | ICD-10-CM | POA: Diagnosis not present

## 2023-11-15 DIAGNOSIS — G301 Alzheimer's disease with late onset: Secondary | ICD-10-CM | POA: Diagnosis not present

## 2023-12-30 DIAGNOSIS — G309 Alzheimer's disease, unspecified: Secondary | ICD-10-CM | POA: Diagnosis not present

## 2023-12-30 DIAGNOSIS — F5101 Primary insomnia: Secondary | ICD-10-CM | POA: Diagnosis not present

## 2023-12-30 DIAGNOSIS — F419 Anxiety disorder, unspecified: Secondary | ICD-10-CM | POA: Diagnosis not present

## 2023-12-30 DIAGNOSIS — Z515 Encounter for palliative care: Secondary | ICD-10-CM | POA: Diagnosis not present

## 2023-12-30 DIAGNOSIS — R52 Pain, unspecified: Secondary | ICD-10-CM | POA: Diagnosis not present

## 2024-02-27 DIAGNOSIS — F419 Anxiety disorder, unspecified: Secondary | ICD-10-CM | POA: Diagnosis not present

## 2024-02-27 DIAGNOSIS — G309 Alzheimer's disease, unspecified: Secondary | ICD-10-CM | POA: Diagnosis not present

## 2024-02-27 DIAGNOSIS — Z515 Encounter for palliative care: Secondary | ICD-10-CM | POA: Diagnosis not present

## 2024-02-27 DIAGNOSIS — F5101 Primary insomnia: Secondary | ICD-10-CM | POA: Diagnosis not present

## 2024-04-25 DIAGNOSIS — R52 Pain, unspecified: Secondary | ICD-10-CM | POA: Diagnosis not present

## 2024-04-25 DIAGNOSIS — G309 Alzheimer's disease, unspecified: Secondary | ICD-10-CM | POA: Diagnosis not present

## 2024-04-25 DIAGNOSIS — Z515 Encounter for palliative care: Secondary | ICD-10-CM | POA: Diagnosis not present

## 2024-05-14 DIAGNOSIS — G309 Alzheimer's disease, unspecified: Secondary | ICD-10-CM | POA: Diagnosis not present

## 2024-05-14 DIAGNOSIS — F419 Anxiety disorder, unspecified: Secondary | ICD-10-CM | POA: Diagnosis not present

## 2024-05-14 DIAGNOSIS — Z515 Encounter for palliative care: Secondary | ICD-10-CM | POA: Diagnosis not present

## 2024-05-28 DIAGNOSIS — F419 Anxiety disorder, unspecified: Secondary | ICD-10-CM | POA: Diagnosis not present

## 2024-05-28 DIAGNOSIS — Z515 Encounter for palliative care: Secondary | ICD-10-CM | POA: Diagnosis not present

## 2024-05-28 DIAGNOSIS — R52 Pain, unspecified: Secondary | ICD-10-CM | POA: Diagnosis not present

## 2024-05-28 DIAGNOSIS — G309 Alzheimer's disease, unspecified: Secondary | ICD-10-CM | POA: Diagnosis not present

## 2024-05-28 DIAGNOSIS — F5101 Primary insomnia: Secondary | ICD-10-CM | POA: Diagnosis not present

## 2024-06-05 DIAGNOSIS — G309 Alzheimer's disease, unspecified: Secondary | ICD-10-CM | POA: Diagnosis not present

## 2024-06-05 DIAGNOSIS — F5101 Primary insomnia: Secondary | ICD-10-CM | POA: Diagnosis not present

## 2024-06-05 DIAGNOSIS — Z515 Encounter for palliative care: Secondary | ICD-10-CM | POA: Diagnosis not present

## 2024-06-15 DIAGNOSIS — Z515 Encounter for palliative care: Secondary | ICD-10-CM | POA: Diagnosis not present

## 2024-06-15 DIAGNOSIS — G309 Alzheimer's disease, unspecified: Secondary | ICD-10-CM | POA: Diagnosis not present

## 2024-06-23 DEATH — deceased
